# Patient Record
Sex: Female | Born: 1975 | Race: Black or African American | Hispanic: No | Marital: Married | Smoking: Never smoker
Health system: Southern US, Community
[De-identification: ages and names within clinical notes are randomized; demographics above are authoritative.]

## PROBLEM LIST (undated history)

## (undated) DIAGNOSIS — R8789 Other abnormal findings in specimens from female genital organs: Secondary | ICD-10-CM

## (undated) DIAGNOSIS — R87619 Unspecified abnormal cytological findings in specimens from cervix uteri: Secondary | ICD-10-CM

## (undated) DIAGNOSIS — I10 Essential (primary) hypertension: Secondary | ICD-10-CM

## (undated) DIAGNOSIS — K219 Gastro-esophageal reflux disease without esophagitis: Secondary | ICD-10-CM

## (undated) DIAGNOSIS — G4733 Obstructive sleep apnea (adult) (pediatric): Secondary | ICD-10-CM

## (undated) DIAGNOSIS — D649 Anemia, unspecified: Secondary | ICD-10-CM

## (undated) DIAGNOSIS — R87618 Other abnormal cytological findings on specimens from cervix uteri: Secondary | ICD-10-CM

## (undated) HISTORY — DX: Anemia, unspecified: D64.9

## (undated) HISTORY — DX: Obstructive sleep apnea (adult) (pediatric): G47.33

## (undated) HISTORY — DX: Gastro-esophageal reflux disease without esophagitis: K21.9

## (undated) HISTORY — DX: Other abnormal cytological findings on specimens from cervix uteri: R87.618

## (undated) HISTORY — DX: Other abnormal findings in specimens from female genital organs: R87.89

## (undated) HISTORY — DX: Unspecified abnormal cytological findings in specimens from cervix uteri: R87.619

---

## 2006-02-21 HISTORY — PX: WISDOM TOOTH EXTRACTION: SHX21

## 2014-02-21 DIAGNOSIS — Z5189 Encounter for other specified aftercare: Secondary | ICD-10-CM

## 2014-02-21 HISTORY — DX: Encounter for other specified aftercare: Z51.89

## 2018-03-25 ENCOUNTER — Emergency Department (HOSPITAL_COMMUNITY)
Admission: EM | Admit: 2018-03-25 | Discharge: 2018-03-26 | Disposition: A | Discharge status: Home / Self Care | Attending: Emergency Medicine | Admitting: Emergency Medicine

## 2018-03-25 ENCOUNTER — Encounter (HOSPITAL_COMMUNITY): Payer: Self-pay | Admitting: Emergency Medicine

## 2018-03-25 DIAGNOSIS — R519 Headache, unspecified: Secondary | ICD-10-CM

## 2018-03-25 DIAGNOSIS — R51 Headache: Secondary | ICD-10-CM | POA: Insufficient documentation

## 2018-03-25 DIAGNOSIS — I10 Essential (primary) hypertension: Secondary | ICD-10-CM | POA: Insufficient documentation

## 2018-03-25 HISTORY — DX: Essential (primary) hypertension: I10

## 2018-03-25 NOTE — ED Triage Notes (Signed)
Pt reports HA with emesis onset earlier this afternoon, approx 3PM. Pt states she checked her BP after and was getting readings (160's/110's) Pt recently moved here and has not established PCP so she has been off her BP meds. No neuro S/S, denies dizziness, lightheadedness, numbness/tingling, weakness, etc.

## 2018-03-26 MED ORDER — NIFEDIPINE ER OSMOTIC RELEASE 30 MG PO TB24: 30.0000 mg | ORAL_TABLET | Freq: Every day | ORAL | 0 refills | Status: DC

## 2018-03-26 NOTE — Discharge Instructions (Addendum)
Continue to try to get established with a primary care provider for further management of your blood pressure. Return to the emergency department with any chest pain, shortness of breath, severe headache, new concern.

## 2018-03-26 NOTE — ED Notes (Addendum)
Pt reports having a headache and going to a friend who does some straight needling. Pt thought this might help her headache. Pt's friend checked her blood pressure and advised her she needed to come into the ED for evaluation. Pt also reports vomiting.

## 2018-03-26 NOTE — ED Notes (Signed)
Patient verbalizes understanding of discharge instructions. Opportunity for questioning and answers were provided. Armband removed by staff, pt discharged from ED home via POV with family. 

## 2018-03-26 NOTE — ED Provider Notes (Signed)
[facility] EMERGENCY DEPARTMENT Provider Note   CSN: [id number] Arrival date & time: 03/25/18  2242     History   Chief Complaint Chief Complaint  Patient presents with  . Hypertension    HPI Tonya Dominguez is a 42 y.o. female.  Patient to ED with concern for elevated blood pressure. She has a history of Hypertension, out of medication since August of last year. She started having a headache earlier today, frontal, bilateral, without modifying factors. History of migraine with different symptoms. She reports nausea with vomiting x 2 today, but also that she colored her hair and felt typical nausea at the smell of the chemicals. No weakness, chest pain, SOB, extremity edema, visual impairment.    Hypertension  Associated symptoms include headaches. Pertinent negatives include no chest pain, no abdominal pain and no shortness of breath.    Past Medical History:  Diagnosis Date  . Hypertension     There are no active problems to display for this patient.   Past Surgical History:  Procedure Laterality Date  . CESAREAN SECTION       OB History   No obstetric history on file.      Home Medications    Prior to Admission medications   Not on File    Family History No family history on file.  Social History Social History   Tobacco Use  . Smoking status: Never Smoker  . Smokeless tobacco: Never Used  Substance Use Topics  . Alcohol use: Never    Frequency: Never  . Drug use: Never     Allergies   Patient has no allergy information on record.   Review of Systems Review of Systems  Constitutional: Negative for chills and fever.  HENT: Negative.   Respiratory: Negative.  Negative for shortness of breath.   Cardiovascular: Negative.  Negative for chest pain, palpitations and leg swelling.  Gastrointestinal: Positive for nausea and vomiting. Negative for abdominal pain.  Musculoskeletal: Negative.   Skin: Negative.   Neurological:  Positive for headaches. Negative for facial asymmetry, speech difficulty, weakness and numbness.     Physical Exam Updated Vital Signs BP (!) 162/109 (BP Location: Right Arm)   Pulse 71   Temp 98.3 F (36.8 C) (Oral)   Resp 18   Ht 5\' 6"  (1.676 m)   Wt 102.1 kg   SpO2 100%   BMI 36.32 kg/m   Physical Exam Vitals signs and nursing note reviewed.  Constitutional:      Appearance: She is well-developed.  HENT:     Head: Normocephalic.     Nose:     Right Sinus: Maxillary sinus tenderness present.     Left Sinus: Maxillary sinus tenderness present.     Mouth/Throat:     Mouth: Mucous membranes are moist.  Eyes:     Extraocular Movements: Extraocular movements intact.     Conjunctiva/sclera: Conjunctivae normal.     Pupils: Pupils are equal, round, and reactive to light.     Comments: No papilledema   Neck:     Musculoskeletal: Normal range of motion and neck supple. No muscular tenderness.     Vascular: No carotid bruit.  Cardiovascular:     Rate and Rhythm: Normal rate and regular rhythm.  Pulmonary:     Effort: Pulmonary effort is normal.     Breath sounds: Normal breath sounds.  Abdominal:     General: Bowel sounds are normal.     Palpations: Abdomen is soft.  Tenderness: There is no abdominal tenderness. There is no guarding or rebound.  Musculoskeletal: Normal range of motion.     Right lower leg: No edema.     Left lower leg: No edema.  Skin:    General: Skin is warm and dry.     Findings: No rash.  Neurological:     Mental Status: She is alert and oriented to person, place, and time.     Comments: Hyperreflexia bilateral LE's. CN's 3-12 grossly intact. Speech is clear and focused. No facial asymmetry. No lateralizing weakness. Reflexes are equal. No deficits of coordination. Ambulatory without imbalance.        ED Treatments / Results  Labs (all labs ordered are listed, but only abnormal results are displayed) Labs Reviewed - No data to  display  EKG None  Radiology No results found.  Procedures Procedures (including critical care time)  Medications Ordered in ED Medications - No data to display   Initial Impression / Assessment and Plan / ED Course  I have reviewed the triage vital signs and the nursing notes.  Pertinent labs & imaging results that were available during my care of the patient were reviewed by me and considered in my medical decision making (see chart for details).     Patient to ED with concern for untreated blood pressure, found elevated today. She had a headache with nausea and vomiting as well. Headache has almost completely resolved spontaneously. Nausea resolved.   She has a normal neurologic exam. She has mild maxillary sinus tenderness on exam. Do not feel the headache is significant neurologically. No concerning symptoms related to blood pressure. She states she was on nifedipine 30 mg daily last year. Will restart her medication. She is actively working on establishing with primary care now.   Discussed return precautions.   Final Clinical Impressions(s) / ED Diagnoses   Final diagnoses:  None   1. Hypertension 2. Sinus headache  ED Discharge Orders    None       Charlann Lange, PA-C 03/26/18 0159    Orpah Greek, MD 03/26/18 [phone]

## 2018-04-04 ENCOUNTER — Ambulatory Visit (INDEPENDENT_AMBULATORY_CARE_PROVIDER_SITE_OTHER): Admitting: Family Medicine

## 2018-04-04 ENCOUNTER — Encounter: Payer: Self-pay | Admitting: Family Medicine

## 2018-04-04 VITALS — BP 140/92 | HR 54 | Temp 97.7°F | Resp 16 | Ht 65.5 in | Wt 225.4 lb

## 2018-04-04 DIAGNOSIS — Z7689 Persons encountering health services in other specified circumstances: Secondary | ICD-10-CM

## 2018-04-04 DIAGNOSIS — E669 Obesity, unspecified: Secondary | ICD-10-CM | POA: Insufficient documentation

## 2018-04-04 DIAGNOSIS — I10 Essential (primary) hypertension: Secondary | ICD-10-CM | POA: Insufficient documentation

## 2018-04-04 DIAGNOSIS — R87619 Unspecified abnormal cytological findings in specimens from cervix uteri: Secondary | ICD-10-CM | POA: Insufficient documentation

## 2018-04-04 DIAGNOSIS — Z9889 Other specified postprocedural states: Secondary | ICD-10-CM

## 2018-04-04 DIAGNOSIS — Z9189 Other specified personal risk factors, not elsewhere classified: Secondary | ICD-10-CM

## 2018-04-04 DIAGNOSIS — R8789 Other abnormal findings in specimens from female genital organs: Secondary | ICD-10-CM

## 2018-04-04 DIAGNOSIS — R87618 Other abnormal cytological findings on specimens from cervix uteri: Secondary | ICD-10-CM | POA: Insufficient documentation

## 2018-04-04 DIAGNOSIS — R0683 Snoring: Secondary | ICD-10-CM | POA: Insufficient documentation

## 2018-04-04 DIAGNOSIS — G4719 Other hypersomnia: Secondary | ICD-10-CM | POA: Insufficient documentation

## 2018-04-04 DIAGNOSIS — Z1239 Encounter for other screening for malignant neoplasm of breast: Secondary | ICD-10-CM

## 2018-04-04 DIAGNOSIS — Z833 Family history of diabetes mellitus: Secondary | ICD-10-CM | POA: Insufficient documentation

## 2018-04-04 DIAGNOSIS — G4733 Obstructive sleep apnea (adult) (pediatric): Secondary | ICD-10-CM | POA: Insufficient documentation

## 2018-04-04 HISTORY — DX: Other specified postprocedural states: Z98.890

## 2018-04-04 LAB — POCT URINALYSIS DIP (PROADVANTAGE DEVICE)
Bilirubin, UA: NEGATIVE
Glucose, UA: NEGATIVE mg/dL
Ketones, POC UA: NEGATIVE mg/dL
LEUKOCYTES UA: NEGATIVE
Nitrite, UA: NEGATIVE
Protein Ur, POC: NEGATIVE mg/dL
Specific Gravity, Urine: 1.015
Urobilinogen, Ur: NEGATIVE
pH, UA: 6.5 (ref 5.0–8.0)

## 2018-04-04 NOTE — Patient Instructions (Addendum)
[facility]   Continue checking your BP twice daily for now. If your readings are not closer to goal range (<130/80) in the next couple of weeks then iIncrease your nifedipine to 60 mg and return to see me in 4 weeks.   Cut back on sodium, read the nutrition labels. No more than 2,500 mg of sodium daily.   Call and schedule with an OB/GYN. Some offices are listed below.    DASH Eating Plan DASH stands for "Dietary Approaches to Stop Hypertension." The DASH eating plan is a healthy eating plan that has been shown to reduce high blood pressure (hypertension). It may also reduce your risk for type 2 diabetes, heart disease, and stroke. The DASH eating plan may also help with weight loss. What are tips for following this plan?  General guidelines  Avoid eating more than 2,300 mg (milligrams) of salt (sodium) a day. If you have hypertension, you may need to reduce your sodium intake to 1,500 mg a day.  Limit alcohol intake to no more than 1 drink a day for nonpregnant women and 2 drinks a day for men. One drink equals 12 oz of beer, 5 oz of wine, or 1 oz of hard liquor.  Work with your health care provider to maintain a healthy body weight or to lose weight. Ask what an ideal weight is for you.  Get at least 30 minutes of exercise that causes your heart to beat faster (aerobic exercise) most days of the week. Activities may include walking, swimming, or biking.  Work with your health care provider or diet and nutrition specialist (dietitian) to adjust your eating plan to your individual calorie needs. Reading food labels   Check food labels for the amount of sodium per serving. Choose foods with less than 5 percent of the Daily Value of sodium. Generally, foods with less than 300 mg of sodium per serving fit into this eating plan.  To find whole grains, look for the word "whole" as the first word in the ingredient list. Shopping  Buy products  labeled as "low-sodium" or "no salt added."  Buy fresh foods. Avoid canned foods and premade or frozen meals. Cooking  Avoid adding salt when cooking. Use salt-free seasonings or herbs instead of table salt or sea salt. Check with your health care provider or pharmacist before using salt substitutes.  Do not fry foods. Cook foods using healthy methods such as baking, boiling, grilling, and broiling instead.  Cook with heart-healthy oils, such as olive, canola, soybean, or sunflower oil. Meal planning  Eat a balanced diet that includes: ? 5 or more servings of fruits and vegetables each day. At each meal, try to fill half of your plate with fruits and vegetables. ? Up to 6-8 servings of whole grains each day. ? Less than 6 oz of lean meat, poultry, or fish each day. A 3-oz serving of meat is about the same size as a deck of cards. One egg equals 1 oz. ? 2 servings of low-fat dairy each day. ? A serving of nuts, seeds, or beans 5 times each week. ? Heart-healthy fats. Healthy fats called Omega-3 fatty acids are found in foods such as flaxseeds and coldwater fish, like sardines, salmon, and mackerel.  Limit how much you eat of the following: ? Canned or prepackaged foods. ? Food that is high in trans fat, such as fried foods. ? Food that is high in saturated fat, such as fatty  meat. ? Sweets, desserts, sugary drinks, and other foods with added sugar. ? Full-fat dairy products.  Do not salt foods before eating.  Try to eat at least 2 vegetarian meals each week.  Eat more home-cooked food and less restaurant, buffet, and fast food.  When eating at a restaurant, ask that your food be prepared with less salt or no salt, if possible. What foods are recommended? The items listed may not be a complete list. Talk with your dietitian about what dietary choices are best for you. Grains Whole-grain or whole-wheat bread. Whole-grain or whole-wheat pasta. Brown rice. Modena Morrow. Bulgur.  Whole-grain and low-sodium cereals. Pita bread. Low-fat, low-sodium crackers. Whole-wheat flour tortillas. Vegetables Fresh or frozen vegetables (raw, steamed, roasted, or grilled). Low-sodium or reduced-sodium tomato and vegetable juice. Low-sodium or reduced-sodium tomato sauce and tomato paste. Low-sodium or reduced-sodium canned vegetables. Fruits All fresh, dried, or frozen fruit. Canned fruit in natural juice (without added sugar). Meat and other protein foods Skinless chicken or [place]. Ground chicken or [place]. Pork with fat trimmed off. Fish and seafood. Egg whites. Dried beans, peas, or lentils. Unsalted nuts, nut butters, and seeds. Unsalted canned beans. Lean cuts of beef with fat trimmed off. Low-sodium, lean deli meat. Dairy Low-fat (1%) or fat-free (skim) milk. Fat-free, low-fat, or reduced-fat cheeses. Nonfat, low-sodium ricotta or cottage cheese. Low-fat or nonfat yogurt. Low-fat, low-sodium cheese. Fats and oils Soft margarine without trans fats. Vegetable oil. Low-fat, reduced-fat, or light mayonnaise and salad dressings (reduced-sodium). Canola, safflower, olive, soybean, and sunflower oils. Avocado. Seasoning and other foods Herbs. Spices. Seasoning mixes without salt. Unsalted popcorn and pretzels. Fat-free sweets. What foods are not recommended? The items listed may not be a complete list. Talk with your dietitian about what dietary choices are best for you. Grains Baked goods made with fat, such as croissants, muffins, or some breads. Dry pasta or rice meal packs. Vegetables Creamed or fried vegetables. Vegetables in a cheese sauce. Regular canned vegetables (not low-sodium or reduced-sodium). Regular canned tomato sauce and paste (not low-sodium or reduced-sodium). Regular tomato and vegetable juice (not low-sodium or reduced-sodium). Angie Fava. Olives. Fruits Canned fruit in a light or heavy syrup. Fried fruit. Fruit in cream or butter sauce. Meat and other protein  foods Fatty cuts of meat. Ribs. Fried meat. Berniece Salines. Sausage. Bologna and other processed lunch meats. Salami. Fatback. Hotdogs. Bratwurst. Salted nuts and seeds. Canned beans with added salt. Canned or smoked fish. Whole eggs or egg yolks. Chicken or [place] with skin. Dairy Whole or 2% milk, cream, and half-and-half. Whole or full-fat cream cheese. Whole-fat or sweetened yogurt. Full-fat cheese. Nondairy creamers. Whipped toppings. Processed cheese and cheese spreads. Fats and oils Butter. Stick margarine. Lard. Shortening. Ghee. Bacon fat. Tropical oils, such as coconut, palm kernel, or palm oil. Seasoning and other foods Salted popcorn and pretzels. Onion salt, garlic salt, seasoned salt, table salt, and sea salt. Worcestershire sauce. Tartar sauce. Barbecue sauce. Teriyaki sauce. Soy sauce, including reduced-sodium. Steak sauce. Canned and packaged gravies. Fish sauce. Oyster sauce. Cocktail sauce. Horseradish that you find on the shelf. Ketchup. Mustard. Meat flavorings and tenderizers. Bouillon cubes. Hot sauce and Tabasco sauce. Premade or packaged marinades. Premade or packaged taco seasonings. Relishes. Regular salad dressings. Where to find more information:  National Heart, Lung, and Hall: https://wilson-eaton.com/  American Heart Association: www.heart.org Summary  The DASH eating plan is a healthy eating plan that has been shown to reduce high blood pressure (hypertension). It may also reduce your risk for type  2 diabetes, heart disease, and stroke.  With the DASH eating plan, you should limit salt (sodium) intake to 2,300 mg a day. If you have hypertension, you may need to reduce your sodium intake to 1,500 mg a day.  When on the DASH eating plan, aim to eat more fresh fruits and vegetables, whole grains, lean proteins, low-fat dairy, and heart-healthy fats.  Work with your health care provider or diet and nutrition specialist (dietitian) to adjust your eating plan to your  individual calorie needs. This information is not intended to replace advice given to you by your health care provider. Make sure you discuss any questions you have with your health care provider. Document Released: 01/27/2011 Document Revised: 02/01/2016 Document Reviewed: 02/01/2016 Elsevier Interactive Patient Education  2019 [place] Offices:   [place], [place] [address] Phone: [phone]  [facility] [address] [place], [place] [phone]  [facility] Address: [address] [place], [place] [address] Phone: [phone]  [place] [address] [place], [place] [address] Phone: [phone]

## 2018-04-04 NOTE — Progress Notes (Signed)
Subjective:    Patient ID: Tonya Dominguez, female    DOB: 15-Jul-1975, 43 y.o.   MRN: [id number]  HPI Chief Complaint  Patient presents with  . np    np BP went to ER [facility] sunday   She is new to the practice and here to establish care. Moved here one year ago.  Previous medical care: [facility]. [place] prior to that.   Reports a history of hypertension and was recently seen in the ED on 03/25/2018 and started back on blood pressure medication.  She reportedly had been out of medication for several months. States her BP has been elevated since delivery of her second child 3 years ago. She nursed until 69 months.   Checks her blood pressure at home and the readings have been in the 140s/80s-90s. .   She has taken lisinopril in the past. She did not want to take it due to potential side effects.   Diet has been high in sodium.   States her husband tells her she snores which is new over the past few months. She is very tired during the daytime and is concerned she may have sleep apnea. Does not feel well rested in the mornings. Falls asleep when sitting down during the afternoon.   Denies fever, chills, dizziness, chest pain, palpitations, shortness of breath, abdominal pain, N/V/D, urinary symptoms, LE edema.    LMP: 04/01/2018 Is not planning on having another child but is not using contraception.   Married and has 2 children. 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   Alcohol use is social only.  Never smoked. No recreational drug use.   Pap smear in past abnormal in the past with HPV. Had a colposcopy. Plans to schedule with an OB/GYN.   States she is overdue for mammogram and requests an order to get this updated.   Reviewed allergies, medications, past medical, surgical, family, and social history.   Review of Systems Pertinent positives and negatives in the history of present illness.     Objective:   Physical Exam BP (!) 140/92   Pulse (!) 54   Temp 97.7  F (36.5 C) (Oral)   Resp 16   Ht 5' 5.5" (1.664 m)   Wt 225 lb 6.4 oz (102.2 kg)   LMP 04/01/2018 (Exact Date)   SpO2 99%   BMI 36.94 kg/m   Alert and oriented and in no distress.  Pharyngeal area is normal. Neck is supple without adenopathy or thyromegaly. Cardiac exam shows a regular sinus rhythm without murmurs or gallops. Lungs are clear to auscultation. Extremities without edema. Skin is warm and dry. PERRLA, CNs intact. DTRs symmetric and normal.       Assessment & Plan:  Essential hypertension - Plan: CBC with Differential/Platelet, Comprehensive metabolic panel, POCT Urinalysis DIP (Proadvantage Device)  Encounter to establish care  Obesity (BMI 30-39.9) - Plan: Lipid panel, TSH, T4, free, Hemoglobin A1c  Pap smear abnormality of cervix/human papillomavirus (HPV) positive  Screening for breast cancer - Plan: MM DIGITAL SCREENING BILATERAL  Family history of diabetes mellitus in first degree relative - Plan: Hemoglobin A1c  History of colposcopy  Snoring - Plan: Home sleep test  Excessive daytime sleepiness - Plan: Home sleep test  At risk for sleep apnea - Plan: Home sleep test  She is a pleasant 43 year old female who is new to the practice and here today to establish care.  She has multiple concerns. Hypertension since having her second child 3  years ago.  She stopped taking blood pressure medication for several months and just started back on nifedipine 1/2 weeks ago when she went to the emergency department for headache and elevated blood pressure.  Blood pressure is gradually improving and she is not having side effects.  We will keep her on her current dose of nifedipine and have her continue checking her blood pressure a couple of times per day for the next 2 to 3 weeks.  Counseling done on healthy diet, specifically cutting back on sodium. We will check labs and have her return in 4 weeks for follow-up on hypertension.  She will bring in her blood pressure  machine and readings.  Discussed that if she continues seeing elevated readings of the next couple of weeks that she may increase her dose to 60 mg. She appears to be at risk for sleep apnea and has started snoring over the past few months.  She is having excessive daytime sleepiness and fatigue.  I will order a home sleep study to evaluate for sleep apnea. Epworth sleepiness scale 11   She is aware that her BMI places her in the obese category.  Counseling done on potential long-term health consequences related to obesity.  History of annual Pap smear and high-risk HPV with colposcopy.  She will call and schedule with an OB/GYN. Mammogram ordered per patient request.  She is aware that she needs to call and schedule this. Request screening for diabetes due to family history.  Urinalysis dipstick: 3+ blood (menses), negative otherwise   Spent 45 minutes face-to-face with patient and at least 50% was in counseling and coordination of care.

## 2018-04-05 LAB — CBC WITH DIFFERENTIAL/PLATELET
Basophils Absolute: 0.1 10*3/uL (ref 0.0–0.2)
Basos: 1 %
EOS (ABSOLUTE): 0.1 10*3/uL (ref 0.0–0.4)
Eos: 1 %
Hematocrit: 37.5 % (ref 34.0–46.6)
Hemoglobin: 11.1 g/dL (ref 11.1–15.9)
Immature Grans (Abs): 0 10*3/uL (ref 0.0–0.1)
Immature Granulocytes: 0 %
Lymphocytes Absolute: 2.5 10*3/uL (ref 0.7–3.1)
Lymphs: 36 %
MCH: 21.6 pg — ABNORMAL LOW (ref 26.6–33.0)
MCHC: 29.6 g/dL — ABNORMAL LOW (ref 31.5–35.7)
MCV: 73 fL — ABNORMAL LOW (ref 79–97)
Monocytes Absolute: 0.6 10*3/uL (ref 0.1–0.9)
Monocytes: 8 %
Neutrophils Absolute: 3.9 10*3/uL (ref 1.4–7.0)
Neutrophils: 54 %
Platelets: 354 10*3/uL (ref 150–450)
RBC: 5.13 x10E6/uL (ref 3.77–5.28)
RDW: 14.6 % (ref 11.7–15.4)
WBC: 7.1 10*3/uL (ref 3.4–10.8)

## 2018-04-05 LAB — COMPREHENSIVE METABOLIC PANEL
ALT: 14 IU/L (ref 0–32)
AST: 18 IU/L (ref 0–40)
Albumin/Globulin Ratio: 1.4 (ref 1.2–2.2)
Albumin: 4.5 g/dL (ref 3.8–4.8)
Alkaline Phosphatase: 85 IU/L (ref 39–117)
BUN/Creatinine Ratio: 12 (ref 9–23)
BUN: 10 mg/dL (ref 6–24)
Bilirubin Total: 0.5 mg/dL (ref 0.0–1.2)
CALCIUM: 9.4 mg/dL (ref 8.7–10.2)
CO2: 21 mmol/L (ref 20–29)
Chloride: 100 mmol/L (ref 96–106)
Creatinine, Ser: 0.86 mg/dL (ref 0.57–1.00)
GFR calc Af Amer: 96 mL/min/{1.73_m2} (ref >59)
GFR, EST NON AFRICAN AMERICAN: 84 mL/min/{1.73_m2} (ref >59)
Globulin, Total: 3.3 g/dL (ref 1.5–4.5)
Glucose: 82 mg/dL (ref 65–99)
Potassium: 4.1 mmol/L (ref 3.5–5.2)
Sodium: 136 mmol/L (ref 134–144)
Total Protein: 7.8 g/dL (ref 6.0–8.5)

## 2018-04-05 LAB — T4, FREE: Free T4: 1.21 ng/dL (ref 0.82–1.77)

## 2018-04-05 LAB — LIPID PANEL
Chol/HDL Ratio: 4.3 ratio (ref 0.0–4.4)
Cholesterol, Total: 209 mg/dL — ABNORMAL HIGH (ref 100–199)
HDL: 49 mg/dL (ref >39)
LDL Calculated: 140 mg/dL — ABNORMAL HIGH (ref 0–99)
TRIGLYCERIDES: 100 mg/dL (ref 0–149)
VLDL Cholesterol Cal: 20 mg/dL (ref 5–40)

## 2018-04-05 LAB — TSH: TSH: 1.41 u[IU]/mL (ref 0.450–4.500)

## 2018-04-05 LAB — HEMOGLOBIN A1C
Est. average glucose Bld gHb Est-mCnc: 105 mg/dL
Hgb A1c MFr Bld: 5.3 % (ref 4.8–5.6)

## 2018-05-02 ENCOUNTER — Other Ambulatory Visit: Payer: Self-pay

## 2018-05-02 ENCOUNTER — Encounter: Payer: Self-pay | Admitting: Family Medicine

## 2018-05-02 ENCOUNTER — Ambulatory Visit (INDEPENDENT_AMBULATORY_CARE_PROVIDER_SITE_OTHER): Admitting: Family Medicine

## 2018-05-02 VITALS — BP 128/90 | HR 88 | Wt 213.8 lb

## 2018-05-02 DIAGNOSIS — E669 Obesity, unspecified: Secondary | ICD-10-CM | POA: Diagnosis not present

## 2018-05-02 DIAGNOSIS — I10 Essential (primary) hypertension: Secondary | ICD-10-CM | POA: Diagnosis not present

## 2018-05-02 NOTE — Progress Notes (Signed)
   Subjective:    Patient ID: Tonya Dominguez, female    DOB: Dec 31, 1975, 43 y.o.   MRN: [id number]  HPI Chief Complaint  Patient presents with  . follow-up    4 week follow-up on bp, today was 122/81, 109/71. running in the normal range    She is here for a 4 week follow-up on hypertension.  She is taking daily nifedipine 30 mg.  Reports blood pressures are in goal range.  No concerns.  Diet low in sodium. Gave up sugar and meat. Lost 12 lbs.  Exercising.   I ordered a sleep study for her and she has an appointment next week.   Denies fever, chills, dizziness, chest pain, palpitations, shortness of breath, abdominal pain, N/V/D, urinary symptoms, LE edema.     Review of Systems Pertinent positives and negatives in the history of present illness.     Objective:   Physical Exam BP 128/90   Pulse 88   Wt 213 lb 12.8 oz (97 kg)   BMI 35.04 kg/m   Alert and oriented and in no acute distress.  Not otherwise examined.      Assessment & Plan:  Essential hypertension  Obesity (BMI 30-39.9)  Congratulated her on making healthy lifestyle changes including diet and exercise.  She reports good daily compliance with nifedipine and no side effects.  Blood pressure readings at home have been in goal range.  She is aware that today her diastolic is elevated.  She will continue to keep an eye on her blood pressure at home.  She will report back if her readings are not consistently in goal range. She plans to continue with weight loss. Has an appointment next week for sleep study.  Suspect sleep apnea.

## 2018-05-09 ENCOUNTER — Ambulatory Visit
Admission: RE | Admit: 2018-05-09 | Discharge: 2018-05-09 | Disposition: A | Discharge status: Home / Self Care | Source: Ambulatory Visit | Attending: Family Medicine | Admitting: Family Medicine

## 2018-05-09 ENCOUNTER — Other Ambulatory Visit: Payer: Self-pay

## 2018-05-09 ENCOUNTER — Ambulatory Visit (HOSPITAL_BASED_OUTPATIENT_CLINIC_OR_DEPARTMENT_OTHER): Attending: Family Medicine | Admitting: Internal Medicine

## 2018-05-09 DIAGNOSIS — Z9189 Other specified personal risk factors, not elsewhere classified: Secondary | ICD-10-CM

## 2018-05-09 DIAGNOSIS — G4733 Obstructive sleep apnea (adult) (pediatric): Secondary | ICD-10-CM | POA: Insufficient documentation

## 2018-05-09 DIAGNOSIS — G4719 Other hypersomnia: Secondary | ICD-10-CM

## 2018-05-09 DIAGNOSIS — R0683 Snoring: Secondary | ICD-10-CM

## 2018-05-09 DIAGNOSIS — Z1239 Encounter for other screening for malignant neoplasm of breast: Secondary | ICD-10-CM

## 2018-05-19 DIAGNOSIS — R0683 Snoring: Secondary | ICD-10-CM

## 2018-05-19 NOTE — Procedures (Signed)
    Patient Name: Tonya Dominguez, Tonya Dominguez Date: 05/09/2018 Gender: Female D.O.B: October 24, 1975 Age (years): 13 Referring Provider: Girtha Rm NP Height (inches): 25 Interpreting Physician: Baird Lyons MD, ABSM Weight (lbs): 213 RPSGT: Jacolyn Reedy BMI: 35 MRN: [id number] Neck Size: 15.00  CLINICAL INFORMATION Sleep Study Type: HST Indication for sleep study: Excessive Daytime Sleepiness, Snoring (786.09) Epworth Sleepiness Score: 13  SLEEP STUDY TECHNIQUE A multi-channel overnight portable sleep study was performed. The channels recorded were: nasal airflow, thoracic respiratory movement, and oxygen saturation with a pulse oximetry. Snoring was also monitored.  MEDICATIONS Patient self administered medications include: none reported.  SLEEP ARCHITECTURE Patient was studied for 457.3 minutes. The sleep efficiency was 100.0 % and the patient was supine for 67.6%. The arousal index was 0.0 per hour.  RESPIRATORY PARAMETERS The overall AHI was 5.1 per hour, with a central apnea index of 0.0 per hour. The oxygen nadir was 67% during sleep.  CARDIAC DATA Mean heart rate during sleep was 64.2 bpm.  IMPRESSIONS - Mild obstructive sleep apnea occurred during this study (AHI = 5.1/h). - No significant central sleep apnea occurred during this study (CAI = 0.0/h). - Moderate oxygen desaturation was noted during this study (Min O2 = 67%). Mean O2 sat 97%.  - Time with O2 saturation 89% or less was 30 minutes. - Patient snored .  DIAGNOSIS - Obstructive Sleep Apnea (327.23 [G47.33 ICD-10])  RECOMMENDATIONS - Treatment for minimal OSA is directed at symptoms. Conservative options may include observation, weight loss and sleep position off back. - Other options including CPAP, a fitted oral appliance, or ENT evaluation, would be based on cvlinical judgment. - Be careful with alcohol, sedatives and other CNS depressants that may worsen sleep apnea and disrupt normal sleep  architecture. - Sleep hygiene should be reviewed to assess factors that may improve sleep quality. - Weight management and regular exercise should be initiated or continued.  [Electronically signed] 05/19/2018 11:03 AM  Baird Lyons MD, ABSM Diplomate, American Board of Sleep Medicine   NPI: [id number]                         [place] [place] [facility]  ELECTRONICALLY SIGNED ON:  05/19/2018, 10:55 AM [place] PH: [phone]   FX: [phone] [place]

## 2018-05-20 ENCOUNTER — Encounter: Payer: Self-pay | Admitting: Family Medicine

## 2018-05-20 DIAGNOSIS — G4733 Obstructive sleep apnea (adult) (pediatric): Secondary | ICD-10-CM

## 2018-05-20 HISTORY — DX: Obstructive sleep apnea (adult) (pediatric): G47.33

## 2018-05-21 ENCOUNTER — Other Ambulatory Visit: Payer: Self-pay | Admitting: Internal Medicine

## 2018-05-21 DIAGNOSIS — G4733 Obstructive sleep apnea (adult) (pediatric): Secondary | ICD-10-CM

## 2018-05-21 MED ORDER — NIFEDIPINE ER OSMOTIC RELEASE 30 MG PO TB24: 30.0000 mg | ORAL_TABLET | Freq: Every day | ORAL | 2 refills | Status: DC

## 2018-08-08 ENCOUNTER — Ambulatory Visit: Admitting: Family Medicine

## 2018-08-09 ENCOUNTER — Other Ambulatory Visit: Payer: Self-pay

## 2018-08-09 ENCOUNTER — Ambulatory Visit (INDEPENDENT_AMBULATORY_CARE_PROVIDER_SITE_OTHER): Admitting: Family Medicine

## 2018-08-09 ENCOUNTER — Encounter: Payer: Self-pay | Admitting: Family Medicine

## 2018-08-09 VITALS — BP 140/82 | HR 75 | Temp 97.7°F | Wt 212.8 lb

## 2018-08-09 DIAGNOSIS — Z87448 Personal history of other diseases of urinary system: Secondary | ICD-10-CM

## 2018-08-09 DIAGNOSIS — I10 Essential (primary) hypertension: Secondary | ICD-10-CM | POA: Diagnosis not present

## 2018-08-09 DIAGNOSIS — E669 Obesity, unspecified: Secondary | ICD-10-CM

## 2018-08-09 DIAGNOSIS — G4733 Obstructive sleep apnea (adult) (pediatric): Secondary | ICD-10-CM

## 2018-08-09 LAB — POCT URINALYSIS DIP (PROADVANTAGE DEVICE)
Bilirubin, UA: NEGATIVE
Blood, UA: NEGATIVE
Glucose, UA: NEGATIVE mg/dL
Ketones, POC UA: NEGATIVE mg/dL
Leukocytes, UA: NEGATIVE
Nitrite, UA: NEGATIVE
Protein Ur, POC: NEGATIVE mg/dL
Specific Gravity, Urine: 1.01
Urobilinogen, Ur: NEGATIVE
pH, UA: 7 (ref 5.0–8.0)

## 2018-08-09 MED ORDER — NIFEDIPINE ER OSMOTIC RELEASE 60 MG PO TB24: 60.0000 mg | ORAL_TABLET | Freq: Every day | ORAL | 2 refills | Status: DC

## 2018-08-09 NOTE — Patient Instructions (Addendum)
Start the 60 mg nifedipine XL tablets once daily.   Continue eating a low sodium diet.   Keep an eye on your BP at home. Goal BP is <130/80 consistently.   Please message me with your BP readings in 2 weeks.   Follow up in December for your fasting annual exam.     DASH Eating Plan DASH stands for "Dietary Approaches to Stop Hypertension." The DASH eating plan is a healthy eating plan that has been shown to reduce high blood pressure (hypertension). It may also reduce your risk for type 2 diabetes, heart disease, and stroke. The DASH eating plan may also help with weight loss. What are tips for following this plan?  General guidelines  Avoid eating more than 2,300 mg (milligrams) of salt (sodium) a day. If you have hypertension, you may need to reduce your sodium intake to 1,500 mg a day.  Limit alcohol intake to no more than 1 drink a day for nonpregnant women and 2 drinks a day for men. One drink equals 12 oz of beer, 5 oz of wine, or 1 oz of hard liquor.  Work with your health care provider to maintain a healthy body weight or to lose weight. Ask what an ideal weight is for you.  Get at least 30 minutes of exercise that causes your heart to beat faster (aerobic exercise) most days of the week. Activities may include walking, swimming, or biking.  Work with your health care provider or diet and nutrition specialist (dietitian) to adjust your eating plan to your individual calorie needs. Reading food labels   Check food labels for the amount of sodium per serving. Choose foods with less than 5 percent of the Daily Value of sodium. Generally, foods with less than 300 mg of sodium per serving fit into this eating plan.  To find whole grains, look for the word "whole" as the first word in the ingredient list. Shopping  Buy products labeled as "low-sodium" or "no salt added."  Buy fresh foods. Avoid canned foods and premade or frozen meals. Cooking  Avoid adding salt when  cooking. Use salt-free seasonings or herbs instead of table salt or sea salt. Check with your health care provider or pharmacist before using salt substitutes.  Do not fry foods. Cook foods using healthy methods such as baking, boiling, grilling, and broiling instead.  Cook with heart-healthy oils, such as olive, canola, soybean, or sunflower oil. Meal planning  Eat a balanced diet that includes: ? 5 or more servings of fruits and vegetables each day. At each meal, try to fill half of your plate with fruits and vegetables. ? Up to 6-8 servings of whole grains each day. ? Less than 6 oz of lean meat, poultry, or fish each day. A 3-oz serving of meat is about the same size as a deck of cards. One egg equals 1 oz. ? 2 servings of low-fat dairy each day. ? A serving of nuts, seeds, or beans 5 times each week. ? Heart-healthy fats. Healthy fats called Omega-3 fatty acids are found in foods such as flaxseeds and coldwater fish, like sardines, salmon, and mackerel.  Limit how much you eat of the following: ? Canned or prepackaged foods. ? Food that is high in trans fat, such as fried foods. ? Food that is high in saturated fat, such as fatty meat. ? Sweets, desserts, sugary drinks, and other foods with added sugar. ? Full-fat dairy products.  Do not salt foods before eating.  Try  to eat at least 2 vegetarian meals each week.  Eat more home-cooked food and less restaurant, buffet, and fast food.  When eating at a restaurant, ask that your food be prepared with less salt or no salt, if possible. What foods are recommended? The items listed may not be a complete list. Talk with your dietitian about what dietary choices are best for you. Grains Whole-grain or whole-wheat bread. Whole-grain or whole-wheat pasta. Brown rice. [place]. Bulgur. Whole-grain and low-sodium cereals. Pita bread. Low-fat, low-sodium crackers. Whole-wheat flour tortillas. Vegetables Fresh or frozen vegetables  (raw, steamed, roasted, or grilled). Low-sodium or reduced-sodium tomato and vegetable juice. Low-sodium or reduced-sodium tomato sauce and tomato paste. Low-sodium or reduced-sodium canned vegetables. Fruits All fresh, dried, or frozen fruit. Canned fruit in natural juice (without added sugar). Meat and other protein foods Skinless chicken or [place]. Ground chicken or [place]. Pork with fat trimmed off. Fish and seafood. Egg whites. Dried beans, peas, or lentils. Unsalted nuts, nut butters, and seeds. Unsalted canned beans. Lean cuts of beef with fat trimmed off. Low-sodium, lean deli meat. Dairy Low-fat (1%) or fat-free (skim) milk. Fat-free, low-fat, or reduced-fat cheeses. Nonfat, low-sodium ricotta or cottage cheese. Low-fat or nonfat yogurt. Low-fat, low-sodium cheese. Fats and oils Soft margarine without trans fats. Vegetable oil. Low-fat, reduced-fat, or light mayonnaise and salad dressings (reduced-sodium). Canola, safflower, olive, soybean, and sunflower oils. Avocado. Seasoning and other foods Herbs. Spices. Seasoning mixes without salt. Unsalted popcorn and pretzels. Fat-free sweets. What foods are not recommended? The items listed may not be a complete list. Talk with your dietitian about what dietary choices are best for you. Grains Baked goods made with fat, such as croissants, muffins, or some breads. Dry pasta or rice meal packs. Vegetables Creamed or fried vegetables. Vegetables in a cheese sauce. Regular canned vegetables (not low-sodium or reduced-sodium). Regular canned tomato sauce and paste (not low-sodium or reduced-sodium). Regular tomato and vegetable juice (not low-sodium or reduced-sodium). Angie Fava. Olives. Fruits Canned fruit in a light or heavy syrup. Fried fruit. Fruit in cream or butter sauce. Meat and other protein foods Fatty cuts of meat. Ribs. Fried meat. Berniece Salines. Sausage. Bologna and other processed lunch meats. Salami. Fatback. Hotdogs. Bratwurst. Salted nuts  and seeds. Canned beans with added salt. Canned or smoked fish. Whole eggs or egg yolks. Chicken or [place] with skin. Dairy Whole or 2% milk, cream, and half-and-half. Whole or full-fat cream cheese. Whole-fat or sweetened yogurt. Full-fat cheese. Nondairy creamers. Whipped toppings. Processed cheese and cheese spreads. Fats and oils Butter. Stick margarine. Lard. Shortening. Ghee. Bacon fat. Tropical oils, such as coconut, palm kernel, or palm oil. Seasoning and other foods Salted popcorn and pretzels. Onion salt, garlic salt, seasoned salt, table salt, and sea salt. Worcestershire sauce. Tartar sauce. Barbecue sauce. Teriyaki sauce. Soy sauce, including reduced-sodium. Steak sauce. Canned and packaged gravies. Fish sauce. Oyster sauce. Cocktail sauce. Horseradish that you find on the shelf. Ketchup. Mustard. Meat flavorings and tenderizers. Bouillon cubes. Hot sauce and Tabasco sauce. Premade or packaged marinades. Premade or packaged taco seasonings. Relishes. Regular salad dressings. Where to find more information:  National Heart, Lung, and [place]: https://wilson-eaton.com/  American Heart Association: www.heart.org Summary  The DASH eating plan is a healthy eating plan that has been shown to reduce high blood pressure (hypertension). It may also reduce your risk for type 2 diabetes, heart disease, and stroke.  With the DASH eating plan, you should limit salt (sodium) intake to 2,300 mg a day. If you  have hypertension, you may need to reduce your sodium intake to 1,500 mg a day.  When on the DASH eating plan, aim to eat more fresh fruits and vegetables, whole grains, lean proteins, low-fat dairy, and heart-healthy fats.  Work with your health care provider or diet and nutrition specialist (dietitian) to adjust your eating plan to your individual calorie needs. This information is not intended to replace advice given to you by your health care provider. Make sure you discuss any questions  you have with your health care provider. Document Released: 01/27/2011 Document Revised: 02/01/2016 Document Reviewed: 02/01/2016 Elsevier Interactive Patient Education  2019 [employer].

## 2018-08-09 NOTE — Progress Notes (Signed)
   Subjective:    Patient ID: Tonya Dominguez, female    DOB: 1975/05/25, 43 y.o.   MRN: [id number]  HPI Chief Complaint  Patient presents with  . 3 month follow-up    osa, bp- doing well but bottom number is alittle high but doing a cleanse.    She is here for a follow up on HTN, OSA and hematuria.   BP at home has been elevated. Ranging from 135-140s-/85-90s. Reports taking nifedipine daily. No side effects. No new concerns.   OSA- using CPAP nightly and has been since March. Sleeping has improved as well as energy level.   She is doing a detox cleanse currently. Avoiding sodium.   She had blood in her urine last visit. No urinary symptoms.   Denies fever, chills, dizziness, chest pain, palpitations, shortness of breath, abdominal pain, N/V/D, LE edema.   Reviewed allergies, medications, past medical, surgical, family, and social history.   Review of Systems Pertinent positives and negatives in the history of present illness.     Objective:   Physical Exam BP 140/82   Pulse 75   Temp 97.7 F (36.5 C) (Oral)   Wt 212 lb 12.8 oz (96.5 kg)   LMP 07/31/2018   BMI 35.41 kg/m   Alert and oriented in no acute distress.  Extremities without edema.      Assessment & Plan:  Essential hypertension - Plan: She is not at goal.  We will increase nifedipine.  Second agent not needed at this time.  She is still having regular periods and not using contraception.  DASH diet handout was given.  Counseling on low-sodium diet and exercise.  Encouraged her to keep an eye on her blood pressure at home and send me her readings in 2 weeks.  History of blood in urine - Plan: POCT Urinalysis DIP (Proadvantage Device), negative today. No further follow up needed.   OSA (obstructive sleep apnea) - Plan: She is using CPAP nightly and benefiting from this.  She should continue using CPAP.  Obesity (BMI 30-39.9) - Plan: She is aware that weight loss could help hypertension and overall health.  She  is currently eating a clean diet.  She will be due for an annual exam in December.  This will be fasting to recheck lipids.

## 2018-11-03 ENCOUNTER — Other Ambulatory Visit: Payer: Self-pay | Admitting: Family Medicine

## 2018-11-03 DIAGNOSIS — I10 Essential (primary) hypertension: Secondary | ICD-10-CM

## 2018-11-23 LAB — HM PAP SMEAR: HM Pap smear: NEGATIVE

## 2019-01-22 ENCOUNTER — Encounter: Payer: Self-pay | Admitting: Family Medicine

## 2019-01-22 ENCOUNTER — Ambulatory Visit (INDEPENDENT_AMBULATORY_CARE_PROVIDER_SITE_OTHER): Admitting: Family Medicine

## 2019-01-22 ENCOUNTER — Other Ambulatory Visit: Payer: Self-pay

## 2019-01-22 VITALS — Temp 98.9°F | Wt 212.0 lb

## 2019-01-22 DIAGNOSIS — J069 Acute upper respiratory infection, unspecified: Secondary | ICD-10-CM | POA: Diagnosis not present

## 2019-01-22 NOTE — Progress Notes (Signed)
   Subjective:    Patient ID: Tonya Dominguez, female    DOB: 08-20-1975, 43 y.o.   MRN: [id number]  HPI Documentation for virtual telephone encounter.  Documentation for virtual audio and video telecommunications through WebEx encounter: The patient was located at home. The provider was located in the office. The patient did consent to this visit and is aware of possible charges through their insurance for this visit. The other persons participating in this telemedicine service were none. This virtual service is not related to other E/M service within previous 7 days. She states that Friday she developed a slight sore throat with some postnasal drainage.  On Saturday she noted hoarse voice, nasal congestion and coughing.  She was seen in employee health on Monday because of the above symptoms and also loss of sense of smell.  The sense of smell did come back after she cleared her nose.  She has had no fever, shortness of breath.  They did a rapid Covid test on her which was negative.   Review of Systems     Objective:   Physical Exam Alert and visually in no distress otherwise not examined       Assessment & Plan:  Viral URI with cough Since her Covid test is negative and her symptoms sound very much like URI I think she can continue to work and wear a mask as well as eye protection.  Recommend Robitussin-DM during the day and NyQuil at night.  Explained this should go away in 7 to 10 days but if this gets worse, she should call.  I will send a note to her employer concerning this.

## 2019-01-23 ENCOUNTER — Encounter: Admitting: Family Medicine

## 2019-02-18 ENCOUNTER — Other Ambulatory Visit: Payer: Self-pay | Admitting: Family Medicine

## 2019-02-18 DIAGNOSIS — I10 Essential (primary) hypertension: Secondary | ICD-10-CM

## 2019-04-20 ENCOUNTER — Other Ambulatory Visit: Payer: Self-pay | Admitting: Family Medicine

## 2019-04-20 DIAGNOSIS — I10 Essential (primary) hypertension: Secondary | ICD-10-CM

## 2019-04-22 NOTE — Telephone Encounter (Signed)
Left message for pt to call back to schedule appt

## 2019-04-23 ENCOUNTER — Telehealth: Payer: Self-pay | Admitting: Family Medicine

## 2019-04-23 DIAGNOSIS — I10 Essential (primary) hypertension: Secondary | ICD-10-CM

## 2019-04-23 MED ORDER — NIFEDIPINE ER OSMOTIC RELEASE 60 MG PO TB24: ORAL_TABLET | ORAL | 0 refills | Status: DC

## 2019-04-23 NOTE — Telephone Encounter (Signed)
done

## 2019-04-23 NOTE — Telephone Encounter (Signed)
Pt called and made a cpe appt. Please refill nifedipine to [facility].

## 2019-05-21 DIAGNOSIS — E7800 Pure hypercholesterolemia, unspecified: Secondary | ICD-10-CM | POA: Insufficient documentation

## 2019-05-21 HISTORY — DX: Pure hypercholesterolemia, unspecified: E78.00

## 2019-05-21 NOTE — Patient Instructions (Addendum)
Here is a list of Eye Doctors that you call and schedule an appointment with.   [facility] Address: [address], [place], [place] [address] Phone: [phone]   [facility] Address: [address], [place], [place] [address] Phone: [phone]  Dr[facility] Address: [address], [place], [place] [address] Phone: [phone]     Preventive Care 40-44 Years Old, Female Preventive care refers to visits with your health care provider and lifestyle choices that can promote health and wellness. This includes:  A yearly physical exam. This may also be called an annual well check.  Regular dental visits and eye exams.  Immunizations.  Screening for certain conditions.  Healthy lifestyle choices, such as eating a healthy diet, getting regular exercise, not using drugs or products that contain nicotine and tobacco, and limiting alcohol use. What can I expect for my preventive care visit? Physical exam Your health care provider will check your:  Height and weight. This may be used to calculate body mass index (BMI), which tells if you are at a healthy weight.  Heart rate and blood pressure.  Skin for abnormal spots. Counseling Your health care provider may ask you questions about your:  Alcohol, tobacco, and drug use.  Emotional well-being.  Home and relationship well-being.  Sexual activity.  Eating habits.  Work and work environment.  Method of birth control.  Menstrual cycle.  Pregnancy history. What immunizations do I need?  Influenza (flu) vaccine  This is recommended every year. Tetanus, diphtheria, and pertussis (Tdap) vaccine  You may need a Td booster every 10 years. Varicella (chickenpox) vaccine  You may need this if you have not been vaccinated. Zoster (shingles) vaccine  You may need this after age 60. Measles, mumps, and rubella (MMR) vaccine  You may need at least one dose of MMR if you were born in  1957 or later. You may also need a second dose. Pneumococcal conjugate (PCV13) vaccine  You may need this if you have certain conditions and were not previously vaccinated. Pneumococcal polysaccharide (PPSV23) vaccine  You may need one or two doses if you smoke cigarettes or if you have certain conditions. Meningococcal conjugate (MenACWY) vaccine  You may need this if you have certain conditions. Hepatitis A vaccine  You may need this if you have certain conditions or if you travel or work in places where you may be exposed to hepatitis A. Hepatitis B vaccine  You may need this if you have certain conditions or if you travel or work in places where you may be exposed to hepatitis B. Haemophilus influenzae type b (Hib) vaccine  You may need this if you have certain conditions. Human papillomavirus (HPV) vaccine  If recommended by your health care provider, you may need three doses over 6 months. You may receive vaccines as individual doses or as more than one vaccine together in one shot (combination vaccines). Talk with your health care provider about the risks and benefits of combination vaccines. What tests do I need? Blood tests  Lipid and cholesterol levels. These may be checked every 5 years, or more frequently if you are over 50 years old.  Hepatitis C test.  Hepatitis B test. Screening  Lung cancer screening. You may have this screening every year starting at age 55 if you have a 30-pack-year history of smoking and currently smoke or have quit within the past 15 years.  Colorectal cancer screening. All adults should have this   screening starting at age 50 and continuing until age 75. Your health care provider may recommend screening at age 45 if you are at increased risk. You will have tests every 1-10 years, depending on your results and the type of screening test.  Diabetes screening. This is done by checking your blood sugar (glucose) after you have not eaten for a  while (fasting). You may have this done every 1-3 years.  Mammogram. This may be done every 1-2 years. Talk with your health care provider about when you should start having regular mammograms. This may depend on whether you have a family history of breast cancer.  BRCA-related cancer screening. This may be done if you have a family history of breast, ovarian, tubal, or peritoneal cancers.  Pelvic exam and Pap test. This may be done every 3 years starting at age 21. Starting at age 30, this may be done every 5 years if you have a Pap test in combination with an HPV test. Other tests  Sexually transmitted disease (STD) testing.  Bone density scan. This is done to screen for osteoporosis. You may have this scan if you are at high risk for osteoporosis. Follow these instructions at home: Eating and drinking  Eat a diet that includes fresh fruits and vegetables, whole grains, lean protein, and low-fat dairy.  Take vitamin and mineral supplements as recommended by your health care provider.  Do not drink alcohol if: ? Your health care provider tells you not to drink. ? You are pregnant, may be pregnant, or are planning to become pregnant.  If you drink alcohol: ? Limit how much you have to 0-1 drink a day. ? Be aware of how much alcohol is in your drink. In the [place] one drink equals one 12 oz bottle of beer (355 mL), one 5 oz glass of wine (148 mL), or one 1 oz glass of hard liquor (44 mL). Lifestyle  Take daily care of your teeth and gums.  Stay active. Exercise for at least 30 minutes on 5 or more days each week.  Do not use any products that contain nicotine or tobacco, such as cigarettes, e-cigarettes, and chewing tobacco. If you need help quitting, ask your health care provider.  If you are sexually active, practice safe sex. Use a condom or other form of birth control (contraception) in order to prevent pregnancy and STIs (sexually transmitted infections).  If told by your  health care provider, take low-dose aspirin daily starting at age 50. What's next?  Visit your health care provider once a year for a well check visit.  Ask your health care provider how often you should have your eyes and teeth checked.  Stay up to date on all vaccines. This information is not intended to replace advice given to you by your health care provider. Make sure you discuss any questions you have with your health care provider. Document Revised: 10/19/2017 Document Reviewed: 10/19/2017 Elsevier Patient Education  2020 Elsevier Inc.  

## 2019-05-21 NOTE — Progress Notes (Signed)
Subjective:    Patient ID: Tonya Dominguez, female    DOB: 12-23-1975, 44 y.o.   MRN: [id number]  HPI Chief Complaint  Patient presents with  . fasting cpe    fasting cpe, no other concerns. has obgyn    She is here for a complete physical exam. Previous medical care: Moved here 2 years ago.  [facility]. [place] prior to that.  Last CPE: years ago   Other providers: OB/GYN - [place]   HTN- no issues with medication. Checks hr BP and it is doing fine   OSA- using CPAP nightly and doing great. Sleeping well, as good as ever.    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   Diet: fairly healthy. Eats a snack around 3 pm.  Excerise: nothing regular   Immunizations: Covid-19 vaccines. Tdap 2016  Health maintenance:  Mammogram: 2020  Colonoscopy: N/A Last Gynecological Exam: 11/202 Last Dental Exam: 2 weeks ago  Last Eye Exam: ?  Wears seatbelt always, smoke detectors in home and functioning, does not text while driving and feels safe in home environment.   Reviewed allergies, medications, past medical, surgical, family, and social history.   Review of Systems Review of Systems Constitutional: -fever, -chills, -sweats, -unexpected weight change,-fatigue ENT: -runny nose, -ear pain, -sore throat Cardiology:  -chest pain, -palpitations, -edema Respiratory: -cough, -shortness of breath, -wheezing Gastroenterology: -abdominal pain, -nausea, -vomiting, -diarrhea, -constipation  Hematology: -bleeding or bruising problems Musculoskeletal: -arthralgias, -myalgias, -joint swelling, -back pain Ophthalmology: -vision changes Urology: -dysuria, -difficulty urinating, -hematuria, -urinary frequency, -urgency Neurology: -headache, -weakness, -tingling, -numbness       Objective:   Physical Exam BP 120/70   Pulse 81    Temp (!) 97.5 F (36.4 C)   Ht 5\' 5"  (1.651 m)   Wt 211 lb 12.8 oz (96.1 kg)   BMI 35.25 kg/m   General Appearance:    Alert, cooperative, no distress, appears stated age  Head:    Normocephalic, without obvious abnormality, atraumatic  Eyes:    PERRL, conjunctiva/corneas clear, EOM's intact  Ears:    Normal TM's and external ear canals  Nose:   Mask in place   Throat:   Mask in place   Neck:   Supple, no lymphadenopathy;  thyroid:  no   enlargement/tenderness/nodules; no JVD  Back:    Spine nontender, no curvature, ROM normal, no CVA     tenderness  Lungs:     Clear to auscultation bilaterally without wheezes, rales or     ronchi; respirations unlabored  Chest Wall:    No tenderness or deformity   Heart:    Regular rate and rhythm, S1 and S2 normal, no murmur, rub   or gallop  Breast Exam:    OB/GYN  Abdomen:     Soft, non-tender, nondistended, normoactive bowel sounds,    no masses, no hepatosplenomegaly  Genitalia:    OB/GYN     Extremities:   No clubbing, cyanosis or edema  Pulses:   2+ and symmetric all extremities  Skin:   Skin color, texture, turgor normal, no rashes or lesions  Lymph nodes:   Cervical, supraclavicular, and axillary nodes normal  Neurologic:   CNII-XII intact, normal strength, sensation and gait; reflexes 2+ and symmetric throughout          Psych:  Normal mood, affect, hygiene and grooming.        Assessment & Plan:  Routine general medical examination at a health care facility - Plan: CBC with Differential/Platelet, Comprehensive metabolic panel, TSH, T4, free, Lipid panel -She is here for fasting CPE.  Reviewed preventive health care.  Discussed immunizations.  Counseling healthy lifestyle including diet and exercise.  She will continue seeing her OB/GYN  Essential hypertension -Blood pressures well controlled.  Continue on current medications  OSA (obstructive sleep apnea) -She is using her CPAP nightly and benefiting from it.  I recommend she  continue using it  Obesity (BMI 30-39.9) -Counseling on healthy diet and exercise.  She is not currently exercising.  Elevated LDL cholesterol level - Plan: Lipid panel -Check lipids and follow-up

## 2019-05-22 ENCOUNTER — Other Ambulatory Visit: Payer: Self-pay

## 2019-05-22 ENCOUNTER — Ambulatory Visit (INDEPENDENT_AMBULATORY_CARE_PROVIDER_SITE_OTHER): Admitting: Family Medicine

## 2019-05-22 ENCOUNTER — Encounter: Payer: Self-pay | Admitting: Family Medicine

## 2019-05-22 VITALS — BP 120/70 | HR 81 | Temp 97.5°F | Ht 65.0 in | Wt 211.8 lb

## 2019-05-22 DIAGNOSIS — E7800 Pure hypercholesterolemia, unspecified: Secondary | ICD-10-CM

## 2019-05-22 DIAGNOSIS — E669 Obesity, unspecified: Secondary | ICD-10-CM

## 2019-05-22 DIAGNOSIS — Z0000 Encounter for general adult medical examination without abnormal findings: Secondary | ICD-10-CM

## 2019-05-22 DIAGNOSIS — I10 Essential (primary) hypertension: Secondary | ICD-10-CM | POA: Diagnosis not present

## 2019-05-22 DIAGNOSIS — G4733 Obstructive sleep apnea (adult) (pediatric): Secondary | ICD-10-CM | POA: Diagnosis not present

## 2019-05-23 LAB — LIPID PANEL
Chol/HDL Ratio: 3.3 ratio (ref 0.0–4.4)
Cholesterol, Total: 190 mg/dL (ref 100–199)
HDL: 58 mg/dL (ref >39)
LDL Chol Calc (NIH): 116 mg/dL — ABNORMAL HIGH (ref 0–99)
Triglycerides: 87 mg/dL (ref 0–149)
VLDL Cholesterol Cal: 16 mg/dL (ref 5–40)

## 2019-05-23 LAB — CBC WITH DIFFERENTIAL/PLATELET
Basophils Absolute: 0 10*3/uL (ref 0.0–0.2)
Basos: 1 %
EOS (ABSOLUTE): 0.1 10*3/uL (ref 0.0–0.4)
Eos: 1 %
Hematocrit: 37.7 % (ref 34.0–46.6)
Hemoglobin: 11.3 g/dL (ref 11.1–15.9)
Immature Grans (Abs): 0 10*3/uL (ref 0.0–0.1)
Immature Granulocytes: 0 %
Lymphocytes Absolute: 1.5 10*3/uL (ref 0.7–3.1)
Lymphs: 26 %
MCH: 21.9 pg — ABNORMAL LOW (ref 26.6–33.0)
MCHC: 30 g/dL — ABNORMAL LOW (ref 31.5–35.7)
MCV: 73 fL — ABNORMAL LOW (ref 79–97)
Monocytes Absolute: 0.5 10*3/uL (ref 0.1–0.9)
Monocytes: 8 %
Neutrophils Absolute: 3.7 10*3/uL (ref 1.4–7.0)
Neutrophils: 64 %
Platelets: 330 10*3/uL (ref 150–450)
RBC: 5.15 x10E6/uL (ref 3.77–5.28)
RDW: 14.7 % (ref 11.7–15.4)
WBC: 5.8 10*3/uL (ref 3.4–10.8)

## 2019-05-23 LAB — COMPREHENSIVE METABOLIC PANEL
ALT: 14 IU/L (ref 0–32)
AST: 19 IU/L (ref 0–40)
Albumin/Globulin Ratio: 1.5 (ref 1.2–2.2)
Albumin: 4.6 g/dL (ref 3.8–4.8)
Alkaline Phosphatase: 82 IU/L (ref 39–117)
BUN/Creatinine Ratio: 6 — ABNORMAL LOW (ref 9–23)
BUN: 6 mg/dL (ref 6–24)
Bilirubin Total: 1 mg/dL (ref 0.0–1.2)
CO2: 22 mmol/L (ref 20–29)
Calcium: 9.5 mg/dL (ref 8.7–10.2)
Chloride: 102 mmol/L (ref 96–106)
Creatinine, Ser: 1.05 mg/dL — ABNORMAL HIGH (ref 0.57–1.00)
GFR calc Af Amer: 75 mL/min/{1.73_m2} (ref >59)
GFR calc non Af Amer: 65 mL/min/{1.73_m2} (ref >59)
Globulin, Total: 3.1 g/dL (ref 1.5–4.5)
Glucose: 85 mg/dL (ref 65–99)
Potassium: 4.3 mmol/L (ref 3.5–5.2)
Sodium: 138 mmol/L (ref 134–144)
Total Protein: 7.7 g/dL (ref 6.0–8.5)

## 2019-05-23 LAB — T4, FREE: Free T4: 1.07 ng/dL (ref 0.82–1.77)

## 2019-05-23 LAB — TSH: TSH: 0.939 u[IU]/mL (ref 0.450–4.500)

## 2019-06-04 ENCOUNTER — Telehealth: Payer: Self-pay | Admitting: Family Medicine

## 2019-06-04 NOTE — Telephone Encounter (Signed)
Requested records received from [facility] 

## 2019-06-10 ENCOUNTER — Other Ambulatory Visit: Payer: Self-pay | Admitting: Family Medicine

## 2019-06-10 DIAGNOSIS — I10 Essential (primary) hypertension: Secondary | ICD-10-CM

## 2019-06-11 ENCOUNTER — Encounter: Payer: Self-pay | Admitting: Internal Medicine

## 2019-09-23 ENCOUNTER — Encounter: Payer: Self-pay | Admitting: Internal Medicine

## 2019-12-18 ENCOUNTER — Other Ambulatory Visit: Payer: Self-pay | Admitting: Family Medicine

## 2019-12-18 DIAGNOSIS — I10 Essential (primary) hypertension: Secondary | ICD-10-CM

## 2020-03-16 ENCOUNTER — Other Ambulatory Visit: Payer: Self-pay | Admitting: Family Medicine

## 2020-03-16 DIAGNOSIS — I10 Essential (primary) hypertension: Secondary | ICD-10-CM

## 2020-03-16 NOTE — Telephone Encounter (Signed)
Has appt in april

## 2020-05-26 ENCOUNTER — Encounter: Payer: Self-pay | Admitting: Family Medicine

## 2020-05-27 ENCOUNTER — Encounter: Payer: Self-pay | Admitting: Family Medicine

## 2020-05-27 ENCOUNTER — Other Ambulatory Visit: Payer: Self-pay

## 2020-05-27 ENCOUNTER — Ambulatory Visit (INDEPENDENT_AMBULATORY_CARE_PROVIDER_SITE_OTHER): Admitting: Family Medicine

## 2020-05-27 VITALS — BP 120/74 | HR 79 | Ht 65.25 in | Wt 209.2 lb

## 2020-05-27 DIAGNOSIS — I10 Essential (primary) hypertension: Secondary | ICD-10-CM

## 2020-05-27 DIAGNOSIS — E7800 Pure hypercholesterolemia, unspecified: Secondary | ICD-10-CM

## 2020-05-27 DIAGNOSIS — Z1159 Encounter for screening for other viral diseases: Secondary | ICD-10-CM

## 2020-05-27 DIAGNOSIS — G4733 Obstructive sleep apnea (adult) (pediatric): Secondary | ICD-10-CM

## 2020-05-27 DIAGNOSIS — Z0000 Encounter for general adult medical examination without abnormal findings: Secondary | ICD-10-CM

## 2020-05-27 DIAGNOSIS — Z1329 Encounter for screening for other suspected endocrine disorder: Secondary | ICD-10-CM

## 2020-05-27 LAB — CBC WITH DIFFERENTIAL/PLATELET: Platelets: 350 10*3/uL (ref 150–450)

## 2020-05-27 LAB — LIPID PANEL

## 2020-05-27 LAB — COMPREHENSIVE METABOLIC PANEL

## 2020-05-27 NOTE — Progress Notes (Signed)
Subjective:    Patient ID: Tonya Dominguez, female    DOB: 02-21-1976, 45 y.o.   MRN: [id number]  HPI Chief Complaint  Patient presents with  . Annual Exam    Fasting for cpe. See obgyn.   She is here for a complete physical exam. Last CPE: 04/2019  Other providers: OB/GYN   OSA- uses CPAP nightly and doing well.   She was going to PT for pelvic floor and hip and it helped.   HTN- doing well on medication.   States she has been taking a probiotic for recurrent yeast infections. It has helped.    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   Reviewed allergies, medications, past medical, surgical, family, and social history.   Review of Systems Review of Systems Constitutional: -fever, -chills, -sweats, -unexpected weight change,-fatigue ENT: -runny nose, -ear pain, -sore throat Cardiology:  -chest pain, -palpitations, -edema Respiratory: -cough, -shortness of breath, -wheezing Gastroenterology: -abdominal pain, -nausea, -vomiting, -diarrhea, -constipation  Hematology: -bleeding or bruising problems Musculoskeletal: -arthralgias, -myalgias, -joint swelling, -back pain Ophthalmology: -vision changes Urology: -dysuria, -difficulty urinating, -hematuria, -urinary frequency, -urgency Neurology: -headache, -weakness, -tingling, -numbness       Objective:   Physical Exam BP 120/74   Pulse 79   Ht 5' 5.25" (1.657 m)   Wt 209 lb 3.2 oz (94.9 kg)   LMP 05/22/2020   SpO2 99%   BMI 34.55 kg/m   General Appearance:    Alert, cooperative, no distress, appears  stated age  Head:    Normocephalic, without obvious abnormality, atraumatic  Eyes:    PERRL, conjunctiva/corneas clear, EOM's intact  Ears:    Normal TM's and external ear canals  Nose:   Mask on   Throat:   Mask on   Neck:   Supple, no lymphadenopathy;  thyroid:  no   enlargement/tenderness/nodules; no JVD  Back:    Spine nontender, no curvature, ROM normal, no CVA     tenderness  Lungs:     Clear to auscultation bilaterally without wheezes, rales or     ronchi; respirations unlabored  Chest Wall:    No tenderness or deformity   Heart:    Regular rate and rhythm, S1 and S2 normal, no murmur, rub   or gallop  Breast Exam:    OB/GYN  Abdomen:     Soft, non-tender, nondistended, normoactive bowel sounds,    no masses, no hepatosplenomegaly  Genitalia:    OB/GYN     Extremities:   No clubbing, cyanosis or edema  Pulses:   2+ and symmetric all extremities  Skin:   Skin color, texture, turgor normal, no rashes or lesions  Lymph nodes:   Cervical, supraclavicular, and axillary nodes normal  Neurologic:   CNII-XII intact, normal strength, sensation and gait          Psych:   Normal mood, affect, hygiene and grooming.        Assessment & Plan:  Routine general medical examination at a health care facility - Plan: CBC with Differential/Platelet, Comprehensive metabolic panel, TSH, T4, free, T3, Lipid panel -preventive healthcare  reviewed. Continue seeing OB/GYN. Counseling on healthy lifestyle. Immunizations reviewed. Discussed safety and health promotion  Essential hypertension - Plan: CBC with Differential/Platelet, Comprehensive metabolic panel -controlled. Continue medication and eating a low sodium diet   OSA (obstructive sleep apnea) -continue CPAP  Elevated LDL cholesterol level - Plan: Lipid panel -advised to eat a low fat diet and exercise. Follow up pending lipid panel   Need for hepatitis C screening test - Plan: Hepatitis C antibody -done per screening guidelines    Screening for thyroid disorder - Plan: TSH, T4, free, T3 -follow up pending labs

## 2020-05-27 NOTE — Patient Instructions (Signed)
Here is a list of Eye Doctors that you call and schedule an appointment with.   [facility] Address: [address], [place], [place] [address] Phone: [phone]   [facility] Address: [address], [place], [place] [address] Phone: [phone]  Dr[facility] Gro[facility] Address: [address], [place], [place] [address] Phone: [phone]   Preventive Care 40-45 Years Old, Female Preventive care refers to lifestyle choices and visits with your health care provider that can promote health and wellness. This includes:  A yearly physical exam. This is also called an annual wellness visit.  Regular dental and eye exams.  Immunizations.  Screening for certain conditions.  Healthy lifestyle choices, such as: ? Eating a healthy diet. ? Getting regular exercise. ? Not using drugs or products that contain nicotine and tobacco. ? Limiting alcohol use. What can I expect for my preventive care visit? Physical exam Your health care provider will check your:  Height and weight. These may be used to calculate your BMI (body mass index). BMI is a measurement that tells if you are at a healthy weight.  Heart rate and blood pressure.  Body temperature.  Skin for abnormal spots. Counseling Your health care provider may ask you questions about your:  Past medical problems.  Family's medical history.  Alcohol, tobacco, and drug use.  Emotional well-being.  Home life and relationship well-being.  Sexual activity.  Diet, exercise, and sleep habits.  Work and work environment.  Access to firearms.  Method of birth control.  Menstrual cycle.  Pregnancy history. What immunizations do I need? Vaccines are usually given at various ages, according to a schedule. Your health care provider will recommend vaccines for you based on your age, medical history, and lifestyle or other factors, such as travel or where you work.   What tests do I need? Blood  tests  Lipid and cholesterol levels. These may be checked every 5 years, or more often if you are over 50 years old.  Hepatitis C test.  Hepatitis B test. Screening  Lung cancer screening. You may have this screening every year starting at age 55 if you have a 30-pack-year history of smoking and currently smoke or have quit within the past 15 years.  Colorectal cancer screening. ? All adults should have this screening starting at age 50 and continuing until age 75. ? Your health care provider may recommend screening at age 45 if you are at increased risk. ? You will have tests every 1-10 years, depending on your results and the type of screening test.  Diabetes screening. ? This is done by checking your blood sugar (glucose) after you have not eaten for a while (fasting). ? You may have this done every 1-3 years.  Mammogram. ? This may be done every 1-2 years. ? Talk with your health care provider about when you should start having regular mammograms. This may depend on whether you have a family history of breast cancer.  BRCA-related cancer screening. This may be done if you have a family history of breast, ovarian, tubal, or peritoneal cancers.  Pelvic exam and Pap test. ? This may be done every 3 years starting at age 21. ? Starting at age 30, this may be done every 5 years if you have a Pap test in combination with an HPV test. Other tests  STD (sexually transmitted disease) testing, if you are at risk.  Bone density scan. This is done to screen for   osteoporosis. You may have this scan if you are at high risk for osteoporosis. Talk with your health care provider about your test results, treatment options, and if necessary, the need for more tests. Follow these instructions at home: Eating and drinking  Eat a diet that includes fresh fruits and vegetables, whole grains, lean protein, and low-fat dairy products.  Take vitamin and mineral supplements as recommended by your  health care provider.  Do not drink alcohol if: ? Your health care provider tells you not to drink. ? You are pregnant, may be pregnant, or are planning to become pregnant.  If you drink alcohol: ? Limit how much you have to 0-1 drink a day. ? Be aware of how much alcohol is in your drink. In the [place] one drink equals one 12 oz bottle of beer (355 mL), one 5 oz glass of wine (148 mL), or one 1 oz glass of hard liquor (44 mL).   Lifestyle  Take daily care of your teeth and gums. Brush your teeth every morning and night with fluoride toothpaste. Floss one time each day.  Stay active. Exercise for at least 30 minutes 5 or more days each week.  Do not use any products that contain nicotine or tobacco, such as cigarettes, e-cigarettes, and chewing tobacco. If you need help quitting, ask your health care provider.  Do not use drugs.  If you are sexually active, practice safe sex. Use a condom or other form of protection to prevent STIs (sexually transmitted infections).  If you do not wish to become pregnant, use a form of birth control. If you plan to become pregnant, see your health care provider for a prepregnancy visit.  If told by your health care provider, take low-dose aspirin daily starting at age 50.  Find healthy ways to cope with stress, such as: ? Meditation, yoga, or listening to music. ? Journaling. ? Talking to a trusted person. ? Spending time with friends and family. Safety  Always wear your seat belt while driving or riding in a vehicle.  Do not drive: ? If you have been drinking alcohol. Do not ride with someone who has been drinking. ? When you are tired or distracted. ? While texting.  Wear a helmet and other protective equipment during sports activities.  If you have firearms in your house, make sure you follow all gun safety procedures. What's next?  Visit your health care provider once a year for an annual wellness visit.  Ask your health care  provider how often you should have your eyes and teeth checked.  Stay up to date on all vaccines. This information is not intended to replace advice given to you by your health care provider. Make sure you discuss any questions you have with your health care provider. Document Revised: 11/12/2019 Document Reviewed: 10/19/2017 Elsevier Patient Education  2021 Elsevier Inc.  

## 2020-05-28 LAB — CBC WITH DIFFERENTIAL/PLATELET
Basophils Absolute: 0 10*3/uL (ref 0.0–0.2)
Basos: 1 %
EOS (ABSOLUTE): 0.1 10*3/uL (ref 0.0–0.4)
Eos: 1 %
Hematocrit: 36.1 % (ref 34.0–46.6)
Hemoglobin: 10.5 g/dL — ABNORMAL LOW (ref 11.1–15.9)
Immature Grans (Abs): 0 10*3/uL (ref 0.0–0.1)
Immature Granulocytes: 0 %
Lymphocytes Absolute: 1.6 10*3/uL (ref 0.7–3.1)
Lymphs: 25 %
MCH: 21.2 pg — ABNORMAL LOW (ref 26.6–33.0)
MCHC: 29.1 g/dL — ABNORMAL LOW (ref 31.5–35.7)
MCV: 73 fL — ABNORMAL LOW (ref 79–97)
Monocytes Absolute: 0.5 10*3/uL (ref 0.1–0.9)
Monocytes: 8 %
Neutrophils Absolute: 4.3 10*3/uL (ref 1.4–7.0)
Neutrophils: 65 %
RBC: 4.96 x10E6/uL (ref 3.77–5.28)
RDW: 14.9 % (ref 11.7–15.4)
WBC: 6.5 10*3/uL (ref 3.4–10.8)

## 2020-05-28 LAB — LIPID PANEL
Chol/HDL Ratio: 3.1 ratio (ref 0.0–4.4)
Cholesterol, Total: 182 mg/dL (ref 100–199)
LDL Chol Calc (NIH): 110 mg/dL — ABNORMAL HIGH (ref 0–99)
Triglycerides: 69 mg/dL (ref 0–149)
VLDL Cholesterol Cal: 13 mg/dL (ref 5–40)

## 2020-05-28 LAB — COMPREHENSIVE METABOLIC PANEL
ALT: 12 IU/L (ref 0–32)
AST: 18 IU/L (ref 0–40)
Albumin/Globulin Ratio: 1.6 (ref 1.2–2.2)
Albumin: 4.5 g/dL (ref 3.8–4.8)
Alkaline Phosphatase: 87 IU/L (ref 44–121)
BUN/Creatinine Ratio: 15 (ref 9–23)
BUN: 16 mg/dL (ref 6–24)
Bilirubin Total: 0.6 mg/dL (ref 0.0–1.2)
CO2: 20 mmol/L (ref 20–29)
Calcium: 9.3 mg/dL (ref 8.7–10.2)
Chloride: 101 mmol/L (ref 96–106)
Creatinine, Ser: 1.05 mg/dL — ABNORMAL HIGH (ref 0.57–1.00)
Glucose: 97 mg/dL (ref 65–99)
Potassium: 4.5 mmol/L (ref 3.5–5.2)
Sodium: 137 mmol/L (ref 134–144)
Total Protein: 7.3 g/dL (ref 6.0–8.5)
eGFR: 67 mL/min/{1.73_m2} (ref >59)

## 2020-05-28 LAB — T4, FREE: Free T4: 1.11 ng/dL (ref 0.82–1.77)

## 2020-05-28 LAB — T3: T3, Total: 124 ng/dL (ref 71–180)

## 2020-05-28 LAB — HEPATITIS C ANTIBODY: Hep C Virus Ab: <0.1 s/co ratio (ref 0.0–0.9)

## 2020-05-28 LAB — TSH: TSH: 1.19 u[IU]/mL (ref 0.450–4.500)

## 2020-06-18 ENCOUNTER — Encounter: Payer: Self-pay | Admitting: Internal Medicine

## 2020-06-24 ENCOUNTER — Other Ambulatory Visit: Payer: Self-pay | Admitting: Family Medicine

## 2020-06-24 DIAGNOSIS — I10 Essential (primary) hypertension: Secondary | ICD-10-CM

## 2020-11-09 ENCOUNTER — Other Ambulatory Visit: Payer: Self-pay | Admitting: Family Medicine

## 2020-11-09 DIAGNOSIS — Z1231 Encounter for screening mammogram for malignant neoplasm of breast: Secondary | ICD-10-CM

## 2020-11-11 ENCOUNTER — Ambulatory Visit
Admission: RE | Admit: 2020-11-11 | Discharge: 2020-11-11 | Disposition: A | Discharge status: Home / Self Care | Source: Ambulatory Visit

## 2020-11-11 ENCOUNTER — Other Ambulatory Visit: Payer: Self-pay

## 2020-11-11 DIAGNOSIS — Z1231 Encounter for screening mammogram for malignant neoplasm of breast: Secondary | ICD-10-CM

## 2020-11-19 ENCOUNTER — Telehealth: Admitting: Family Medicine

## 2020-11-19 ENCOUNTER — Other Ambulatory Visit: Payer: Self-pay

## 2020-11-20 NOTE — Progress Notes (Signed)
Erroreous encounter.

## 2020-12-24 ENCOUNTER — Other Ambulatory Visit: Payer: Self-pay

## 2020-12-24 DIAGNOSIS — I10 Essential (primary) hypertension: Secondary | ICD-10-CM

## 2020-12-24 MED ORDER — NIFEDIPINE ER OSMOTIC RELEASE 60 MG PO TB24: ORAL_TABLET | ORAL | 1 refills | Status: DC

## 2021-06-02 ENCOUNTER — Encounter: Admitting: Family Medicine

## 2021-06-02 NOTE — Progress Notes (Signed)
? ?Complete physical exam ? ? ?Patient: Tonya Dominguez   DOB: 10-22-1975   46 y.o. Female  MRN: [id number] ?Visit Date: 06/03/2021 ? ?Chief Complaint  ?Patient presents with  ? Annual Exam  ?  Fasting CPE- No other concerns.  ? ?Subjective  ?  ?Tonya Dominguez is a 46 y.o. female who presents today for a complete physical exam.  ? ?Reports is generally feeling well; is eating a regular diet; is sleeping well 6 - 8 hours a night; drinks 60 - 80 ounces of water a day; is not exercising; States had Neplanon inserted by Gyn Dr. Waymon Amato on 03/25/2021 and reports weight gain and some afternoon fatigue; states she hasn't contacted her Gyn because she was advised to give her body 6 months to adjust to it. ? ?HPI ?HPI   ? ? Annual Exam   ? Additional comments: Fasting CPE- No other concerns. ? ?  ?  ?Last edited by Deforest Hoyles, [place] on 06/03/2021  8:33 AM.  ?  ?  ? ? ?Past Medical History:  ?Diagnosis Date  ? Abnormal Pap smear of cervix   ? Elevated LDL cholesterol level 05/21/2019  ? Hypertension   ? OSA (obstructive sleep apnea) 05/20/2018  ? Pap smear abnormality of cervix/human papillomavirus (HPV) positive   ? ?Past Surgical History:  ?Procedure Laterality Date  ? CESAREAN SECTION    ? ?Social History  ? ?Socioeconomic History  ? Marital status: Married  ?  Spouse name: Not on file  ? Number of children: Not on file  ? Years of education: Not on file  ? Highest education level: Not on file  ?Occupational History  ? Not on file  ?Tobacco Use  ? Smoking status: Never  ? Smokeless tobacco: Never  ?Vaping Use  ? Vaping Use: Never used  ?Substance and Sexual Activity  ? Alcohol use: Never  ? Drug use: Never  ? Sexual activity: Yes  ?  Partners: Male  ?  Birth control/protection: None  ?Other Topics Concern  ? Not on file  ?Social History Narrative  ? Not on file  ? ?Social Determinants of Health  ? ?Financial Resource Strain: Not on file  ?Food Insecurity: Not on file  ?Transportation Needs: Not on file  ?Physical Activity: Not  on file  ?Stress: Not on file  ?Social Connections: Not on file  ?Intimate Partner Violence: Not on file  ? ?Family Status  ?Relation Name Status  ? Mother  Alive  ? Father  Alive  ? Brother  Alive  ? MGF  Deceased  ? PGM  Deceased  ? PGF  Deceased  ? ?Family History  ?Problem Relation Age of Onset  ? Hypertension Mother   ? Diabetes Mother   ? Allergies Brother   ? Lung cancer Maternal Grandfather   ? ?No Known Allergies  ?Patient Care Team: ?Marcellina Millin as PCP - General (Physician Assistant) ?Waymon Amato, MD as Consulting Physician (Obstetrics and Gynecology)  ? ?Medications: ?Outpatient Medications Prior to Visit  ?Medication Sig  ? [DISCONTINUED] NIFEdipine (PROCARDIA XL/NIFEDICAL XL) 60 MG 24 hr tablet 1 daily  ? MISCELLANEOUS VAGINAL PRODUCTS [facility] vaginally. Vaginal probiotic- once a day (Patient not taking: Reported on 06/03/2021)  ? ?No facility-administered medications prior to visit.  ? ? ?Review of Systems  ?Constitutional:  Positive for unexpected weight change. Negative for activity change and fever.  ?HENT:  Negative for congestion, ear pain and voice change.   ?Eyes:  Negative for  redness.  ?Respiratory:  Negative for cough.   ?Cardiovascular:  Negative for chest pain.  ?Gastrointestinal:  Negative for constipation and diarrhea.  ?Endocrine: Negative for polyuria.  ?Genitourinary:  Negative for flank pain.  ?Musculoskeletal:  Negative for gait problem and neck stiffness.  ?Skin:  Negative for color change and rash.  ?Neurological:  Negative for dizziness.  ?Hematological:  Negative for adenopathy.  ?Psychiatric/Behavioral:  Negative for agitation, behavioral problems and confusion.   ? ?Last CBC ?Lab Results  ?Component Value Date  ? WBC 6.5 05/27/2020  ? HGB 10.5 (L) 05/27/2020  ? HCT 36.1 05/27/2020  ? MCV 73 (L) 05/27/2020  ? MCH 21.2 (L) 05/27/2020  ? RDW 14.9 05/27/2020  ? PLT 350 05/27/2020  ? ?Last metabolic panel ?Lab Results  ?Component Value Date  ? GLUCOSE 97 05/27/2020  ? NA  137 05/27/2020  ? K 4.5 05/27/2020  ? CL 101 05/27/2020  ? CO2 20 05/27/2020  ? BUN 16 05/27/2020  ? CREATININE 1.05 (H) 05/27/2020  ? EGFR 67 05/27/2020  ? CALCIUM 9.3 05/27/2020  ? PROT 7.3 05/27/2020  ? ALBUMIN 4.5 05/27/2020  ? LABGLOB 2.8 05/27/2020  ? AGRATIO 1.6 05/27/2020  ? BILITOT 0.6 05/27/2020  ? ALKPHOS 87 05/27/2020  ? AST 18 05/27/2020  ? ALT 12 05/27/2020  ? ?Last lipids ?Lab Results  ?Component Value Date  ? CHOL 182 05/27/2020  ? HDL 59 05/27/2020  ? LDLCALC 110 (H) 05/27/2020  ? TRIG 69 05/27/2020  ? CHOLHDL 3.1 05/27/2020  ? ?Last hemoglobin A1c ?Lab Results  ?Component Value Date  ? HGBA1C 5.3 04/04/2018  ? ?Last thyroid functions ?Lab Results  ?Component Value Date  ? TSH 1.190 05/27/2020  ? T3TOTAL 124 05/27/2020  ? ?  ? ?The 10-year ASCVD risk score (Arnett DK, et al., 2019) is: 2% ? ? Objective  ?  ?BP 130/80   Pulse 71   Wt 204 lb 6.4 oz (92.7 kg)   LMP 05/17/2021   SpO2 100%   BMI 33.75 kg/m?  ? ?  ? ? ?Physical Exam ?Vitals and nursing note reviewed.  ?Constitutional:   ?   General: She is not in acute distress. ?   Appearance: Normal appearance. She is not ill-appearing.  ?HENT:  ?   Head: Normocephalic and atraumatic.  ?   Right Ear: Tympanic membrane, ear canal and external ear normal.  ?   Left Ear: Tympanic membrane, ear canal and external ear normal.  ?   Nose: No congestion.  ?Eyes:  ?   Extraocular Movements: Extraocular movements intact.  ?   Conjunctiva/sclera: Conjunctivae normal.  ?   Pupils: Pupils are equal, round, and reactive to light.  ?Neck:  ?   Vascular: No carotid bruit.  ?Cardiovascular:  ?   Rate and Rhythm: Normal rate and regular rhythm.  ?   Pulses: Normal pulses.  ?   Heart sounds: Normal heart sounds.  ?Pulmonary:  ?   Effort: Pulmonary effort is normal.  ?   Breath sounds: Normal breath sounds. No wheezing.  ?Abdominal:  ?   General: Bowel sounds are normal.  ?   Palpations: Abdomen is soft.  ?Musculoskeletal:     ?   General: Normal range of motion.  ?    Cervical back: Normal range of motion and neck supple.  ?   Right lower leg: No edema.  ?   Left lower leg: No edema.  ?Skin: ?   General: Skin is warm and dry.  ?  Findings: No bruising.  ?Neurological:  ?   General: No focal deficit present.  ?   Mental Status: She is alert and oriented to person, place, and time.  ?Psychiatric:     ?   Mood and Affect: Mood normal.     ?   Behavior: Behavior normal.     ?   Thought Content: Thought content normal.  ?  ? ? ?Last depression screening scores ? ?  06/03/2021  ?  8:39 AM 05/27/2020  ?  8:28 AM 05/22/2019  ? 10:47 AM  ?PHQ 2/9 Scores  ?PHQ - 2 Score 0 0 0  ? ?Last fall risk screening ? ?  06/03/2021  ?  8:38 AM  ?Fall Risk   ?Falls in the past year? 0  ?Number falls in past yr: 0  ?Injury with Fall? 0  ?Risk for fall due to : No Fall Risks  ?Follow up Falls evaluation completed  ? ? ? ?No results found for any visits on 06/03/21. ? Assessment & Plan  ?  ?Routine Health Maintenance and Physical Exam ? ?Exercise Activities and Dietary recommendations ? Goals   ?None ?  ? ? ?Immunization History  ?Administered Date(s) Administered  ? Influenza Nasal 10/28/2010  ? Influenza Whole 01/25/2010  ? Influenza, Seasonal, Injecte, Preservative Fre 02/26/2014, 02/09/2015  ? Influenza-Unspecified 11/21/2017, 12/20/2019  ? PFIZER Comirnaty(Gray Top)Covid-19 Tri-Sucrose Vaccine 11/26/2019  ? PFIZER(Purple Top)SARS-COV-2 Vaccination 02/06/2019, 03/01/2019  ? Tdap 10/23/2008, 09/05/2014, 09/07/2014  ? ? ?Health Maintenance  ?Topic Date Due  ? COLONOSCOPY (Pts 45-18yr Insurance coverage will need to be confirmed)  Never done  ? COVID-19 Vaccine (4 - Booster for [place]) 01/17/2022 (Originally 01/21/2020)  ? HIV Screening  06/04/2022 (Originally [birth year]1991)  ? INFLUENZA VACCINE  09/21/2021  ? PAP SMEAR-Modifier  11/22/2021  ? TETANUS/TDAP  09/06/2024  ? Hepatitis C Screening  Completed  ? HPV VACCINES  Aged Out  ? ? ?Discussed health benefits of physical activity, and encouraged her to  engage in regular exercise appropriate for her age and condition. ? ?Problem List Items Addressed This Visit   ? ?  ? Cardiovascular and Mediastinum  ? Essential hypertension  ? Relevant Medications  ?

## 2021-06-03 ENCOUNTER — Encounter: Payer: Self-pay | Admitting: Physician Assistant

## 2021-06-03 ENCOUNTER — Ambulatory Visit (INDEPENDENT_AMBULATORY_CARE_PROVIDER_SITE_OTHER): Admitting: Physician Assistant

## 2021-06-03 VITALS — BP 130/80 | HR 71 | Ht 65.25 in | Wt 204.4 lb

## 2021-06-03 DIAGNOSIS — I10 Essential (primary) hypertension: Secondary | ICD-10-CM

## 2021-06-03 DIAGNOSIS — E782 Mixed hyperlipidemia: Secondary | ICD-10-CM | POA: Insufficient documentation

## 2021-06-03 DIAGNOSIS — Z0000 Encounter for general adult medical examination without abnormal findings: Secondary | ICD-10-CM

## 2021-06-03 DIAGNOSIS — Z6833 Body mass index (BMI) 33.0-33.9, adult: Secondary | ICD-10-CM

## 2021-06-03 DIAGNOSIS — D509 Iron deficiency anemia, unspecified: Secondary | ICD-10-CM | POA: Insufficient documentation

## 2021-06-03 DIAGNOSIS — D259 Leiomyoma of uterus, unspecified: Secondary | ICD-10-CM | POA: Insufficient documentation

## 2021-06-03 DIAGNOSIS — Z1211 Encounter for screening for malignant neoplasm of colon: Secondary | ICD-10-CM

## 2021-06-03 DIAGNOSIS — I8000 Phlebitis and thrombophlebitis of superficial vessels of unspecified lower extremity: Secondary | ICD-10-CM | POA: Insufficient documentation

## 2021-06-03 DIAGNOSIS — E6609 Other obesity due to excess calories: Secondary | ICD-10-CM | POA: Diagnosis not present

## 2021-06-03 MED ORDER — NIFEDIPINE ER OSMOTIC RELEASE 60 MG PO TB24: ORAL_TABLET | ORAL | 3 refills | Status: DC

## 2021-06-03 NOTE — Assessment & Plan Note (Addendum)
Stable, drink 8 - 10 glasses of water daily, limit sugar intake and eat a low fat diet, exercise 3 - 5 days a week, example walking 1 - 2 miles; patient advised to contact her Gyn to discuss weight gain after Nexplanon ? ?

## 2021-06-03 NOTE — Assessment & Plan Note (Signed)
controlled, eat a low fat diet, increase fiber intake (Benefiber or Metamucil, Cherrios,  oatmeal, beans, nuts, fruits and vegetables), limit saturated fats (in fried foods, red meat), can add OTC fish oil supplement, eat fish with Omega-3 fatty acids like salmon and tuna, exercise for 30 minutes 3 - 5 times a week, drink 8 - 10 glasses of water a day, lipid panel drawn today ? ? ?

## 2021-06-03 NOTE — Assessment & Plan Note (Signed)
controlled, continue nifedipine xl 60 mg qd , eat a low salt diet, do not add any salt to food when cooking, avoid processed foods, avoid fried foods ? ?

## 2021-06-03 NOTE — Patient Instructions (Signed)
You will get a call to schedule an appointment with Gastroenterology for a routine screening colonoscopy ? ?Preventative Care for Adults - Female ?   ?  MAINTAIN REGULAR HEALTH EXAMS: ?A routine yearly physical is a good way to check in with your primary care provider about your health and preventive screening. It is also an opportunity to share updates about your health and any concerns you have, and receive a thorough all-over exam.  ?Most health insurance companies pay for at least some preventative services.  Check with your health plan for specific coverages. ? ?WHAT PREVENTATIVE SERVICES DO WOMEN NEED? ?Adult women should have their weight and blood pressure checked regularly.  ?Women age 50 and older should have their cholesterol levels checked regularly. ?Women should be screened for cervical cancer with a Pap smear and pelvic exam beginning at either age 36, or 3 years after they become sexually activity.   ?Breast cancer screening generally begins at age 17 with a mammogram and breast exam by your primary care provider.   ?Beginning at age 76 and continuing to age 46, women should be screened for colorectal cancer.  Certain people may need continued testing until age 51. ?Updating vaccinations is part of preventative care.  Vaccinations help protect against diseases such as the flu. ?Osteoporosis is a disease in which the bones lose minerals and strength as we age. Women ages 4 and over should discuss this with their caregivers, as should women after menopause who have other risk factors. ?Lab tests are generally done as part of preventative care to screen for anemia and blood disorders, to screen for problems with the kidneys and liver, to screen for bladder problems, to check blood sugar, and to check your cholesterol level. ?Preventative services generally include counseling about diet, exercise, avoiding tobacco, drugs, excessive alcohol consumption, and sexually transmitted infections.   ? ?GENERAL  RECOMMENDATIONS FOR GOOD HEALTH: ? ?Healthy diet: ?Eat a variety of foods, including fruit, vegetables, animal or vegetable protein, such as meat, fish, chicken, and eggs, or beans, lentils, tofu, and grains, such as rice. ?Drink plenty of water daily (60 - 80 ounces or 8 - 10 glasses a day) ?Decrease saturated fat in the diet, avoid lots of red meat, processed foods, sweets, fast foods, and fried foods. For high cholesterol - Increase fiber intake (Benefiber or Metamucil, Cherrios,  oatmeal, beans, nuts, fruits and vegetables), limit saturated fats (in fried foods, red meat), can add OTC fish oil supplement, eat fish with Omega-3 fatty acids like salmon and tuna, exercise for 30 minutes 3 - 5 times a week, drink 8 - 10 glasses of water a day ? ?Exercise: ?Aerobic exercise helps maintain good heart health. At least 30-40 minutes of moderate-intensity exercise is recommended. For example, a brisk walk that increases your heart rate and breathing. This should be done on most days of the week.  ?Find a type of exercise or a variety of exercises that you enjoy so that it becomes a part of your daily life.  Examples are running, walking, swimming, water aerobics, and biking.  For motivation and support, explore group exercise such as aerobic class, spin class, Zumba, Yoga,or  martial arts, etc.   ?Set exercise goals for yourself, such as a certain weight goal, walk or run in a race such as a 5k walk/run.  Speak to your primary care provider about exercise goals. ? ?Disease prevention: ?If you smoke or chew tobacco, find out from your caregiver how to quit. It can literally  save your life, no matter how long you have been a tobacco user. If you do not use tobacco, never begin.  ?Maintain a healthy diet and normal weight. Increased weight leads to problems with blood pressure and diabetes.  ?The Body Mass Index or BMI is a way of measuring how much of your body is fat. Having a BMI above 27 increases the risk of heart  disease, diabetes, hypertension, stroke and other problems related to obesity. Your caregiver can help determine your BMI and based on it develop an exercise and dietary program to help you achieve or maintain this important measurement at a healthful level. ?High blood pressure causes heart and blood vessel problems.  Persistent high blood pressure should be treated with medicine if weight loss and exercise do not work.  ?Fat and cholesterol leaves deposits in your arteries that can block them. This causes heart disease and vessel disease elsewhere in your body.  If your cholesterol is found to be high, or if you have heart disease or certain other medical conditions, then you may need to have your cholesterol monitored frequently and be treated with medication.  ?Ask if you should have a cardiac stress test if your history suggests this. A stress test is a test done on a treadmill that looks for heart disease. This test can find disease prior to there being a problem. ?Menopause can be associated with physical symptoms and risks. Hormone replacement therapy is available to decrease these. You should talk to your caregiver about whether starting or continuing to take hormones is right for you.  ?Osteoporosis is a disease in which the bones lose minerals and strength as we age. This can result in serious bone fractures. Risk of osteoporosis can be identified using a bone density scan. Women ages 57 and over should discuss this with their caregivers, as should women after menopause who have other risk factors. Ask your caregiver whether you should be taking a calcium supplement and Vitamin D, to reduce the rate of osteoporosis.  ?Avoid drinking alcohol in excess (more than two drinks per day).  Avoid use of street drugs. Do not share needles with anyone. Ask for professional help if you need assistance or instructions on stopping the use of alcohol, cigarettes, and/or drugs. ?Brush your teeth twice a day with  fluoride toothpaste, and floss once a day. Good oral hygiene prevents tooth decay and gum disease. The problems can be painful, unattractive, and can cause other health problems. Visit your dentist for a routine oral and dental check up and preventive care every 6-12 months.  ?Look at your skin regularly.  Use a mirror to look at your back. Notify your caregivers of changes in moles, especially if there are changes in shapes, colors, a size larger than a pencil eraser, an irregular border, or development of new moles. ? ?Safety: ?Use seatbelts 100% of the time, whether driving or as a passenger.  Use safety devices such as hearing protection if you work in environments with loud noise or significant background noise.  Use safety glasses when doing any work that could send debris in to the eyes.  Use a helmet if you ride a bike or motorcycle.  Use appropriate safety gear for contact sports.  Talk to your caregiver about gun safety. ?Use sunscreen with a SPF (or skin protection factor) of 15 or greater.  Lighter skinned people are at a greater risk of skin cancer. Don?t forget to also wear sunglasses in order to protect your  eyes from too much damaging sunlight. Damaging sunlight can accelerate cataract formation.  ?Practice safe sex. Use condoms. Condoms are used for birth control and to help reduce the spread of sexually transmitted infections (or STIs).  Some of the STIs are gonorrhea (the clap), chlamydia, syphilis, trichomonas, herpes, HPV (human papilloma virus) and HIV (human immunodeficiency virus) which causes AIDS. The herpes, HIV and HPV are viral illnesses that have no cure. These can result in disability, cancer and death.  ?Keep carbon monoxide and smoke detectors in your home functioning at all times. Change the batteries every 6 months or use a model that plugs into the wall.  ? ?Vaccinations: ?Stay up to date with your tetanus shots and other required immunizations. You should have a booster for  tetanus every 10 years. Be sure to get your flu shot every year, since 5%-20% of the [place] population comes down with the flu. The flu vaccine changes each year, so being vaccinated once is not enough. Get your shot in

## 2021-06-03 NOTE — Assessment & Plan Note (Signed)
>>  ASSESSMENT AND PLAN FOR CLASS 1 OBESITY DUE TO EXCESS CALORIES WITH SERIOUS COMORBIDITY AND BODY MASS INDEX (BMI) OF 33.0 TO 33.9 IN ADULT WRITTEN ON 06/03/2021  9:30 AM BY WILLIAMS, LYNNE B, PA-C  Stable, drink 8 - 10 glasses of water daily, limit sugar intake and eat a low fat diet, exercise 3 - 5 days a week, example walking 1 - 2 miles; patient advised to contact her Gyn to discuss weight gain after Nexplanon

## 2021-06-04 LAB — COMPREHENSIVE METABOLIC PANEL
ALT: 11 IU/L (ref 0–32)
AST: 14 IU/L (ref 0–40)
Albumin/Globulin Ratio: 1.5 (ref 1.2–2.2)
Albumin: 4.5 g/dL (ref 3.8–4.8)
Alkaline Phosphatase: 78 IU/L (ref 44–121)
BUN/Creatinine Ratio: 10 (ref 9–23)
BUN: 9 mg/dL (ref 6–24)
Bilirubin Total: 0.7 mg/dL (ref 0.0–1.2)
CO2: 22 mmol/L (ref 20–29)
Calcium: 9.1 mg/dL (ref 8.7–10.2)
Chloride: 103 mmol/L (ref 96–106)
Creatinine, Ser: 0.91 mg/dL (ref 0.57–1.00)
Globulin, Total: 3 g/dL (ref 1.5–4.5)
Glucose: 87 mg/dL (ref 70–99)
Potassium: 4.5 mmol/L (ref 3.5–5.2)
Sodium: 138 mmol/L (ref 134–144)
Total Protein: 7.5 g/dL (ref 6.0–8.5)
eGFR: 79 mL/min/{1.73_m2} (ref >59)

## 2021-06-04 LAB — LIPID PANEL
Chol/HDL Ratio: 2.8 ratio (ref 0.0–4.4)
Cholesterol, Total: 193 mg/dL (ref 100–199)
HDL: 69 mg/dL (ref >39)
LDL Chol Calc (NIH): 109 mg/dL — ABNORMAL HIGH (ref 0–99)
Triglycerides: 85 mg/dL (ref 0–149)
VLDL Cholesterol Cal: 15 mg/dL (ref 5–40)

## 2021-06-04 LAB — CBC WITH DIFFERENTIAL/PLATELET
Basophils Absolute: 0.1 10*3/uL (ref 0.0–0.2)
Basos: 1 %
EOS (ABSOLUTE): 0.1 10*3/uL (ref 0.0–0.4)
Eos: 1 %
Hematocrit: 37.8 % (ref 34.0–46.6)
Hemoglobin: 10.9 g/dL — ABNORMAL LOW (ref 11.1–15.9)
Immature Grans (Abs): 0.1 10*3/uL (ref 0.0–0.1)
Immature Granulocytes: 1 %
Lymphocytes Absolute: 1.6 10*3/uL (ref 0.7–3.1)
Lymphs: 20 %
MCH: 21.2 pg — ABNORMAL LOW (ref 26.6–33.0)
MCHC: 28.8 g/dL — ABNORMAL LOW (ref 31.5–35.7)
MCV: 74 fL — ABNORMAL LOW (ref 79–97)
Monocytes Absolute: 0.7 10*3/uL (ref 0.1–0.9)
Monocytes: 9 %
Neutrophils Absolute: 5.4 10*3/uL (ref 1.4–7.0)
Neutrophils: 68 %
Platelets: 318 10*3/uL (ref 150–450)
RBC: 5.13 x10E6/uL (ref 3.77–5.28)
RDW: 15.5 % — ABNORMAL HIGH (ref 11.7–15.4)
WBC: 7.9 10*3/uL (ref 3.4–10.8)

## 2021-06-25 ENCOUNTER — Other Ambulatory Visit: Payer: Self-pay | Admitting: Medical

## 2021-06-25 DIAGNOSIS — I10 Essential (primary) hypertension: Secondary | ICD-10-CM

## 2021-07-14 ENCOUNTER — Encounter: Payer: Self-pay | Admitting: Gastroenterology

## 2021-07-14 ENCOUNTER — Telehealth: Payer: Self-pay | Admitting: Physician Assistant

## 2021-07-14 NOTE — Telephone Encounter (Signed)
Referral followup 

## 2021-07-28 ENCOUNTER — Ambulatory Visit (AMBULATORY_SURGERY_CENTER): Payer: Self-pay

## 2021-07-28 ENCOUNTER — Other Ambulatory Visit: Payer: Self-pay | Admitting: Internal Medicine

## 2021-07-28 VITALS — Ht 65.0 in | Wt 205.0 lb

## 2021-07-28 DIAGNOSIS — Z1211 Encounter for screening for malignant neoplasm of colon: Secondary | ICD-10-CM

## 2021-07-28 MED ORDER — CLENPIQ 10-3.5-12 MG-GM -GM/160ML PO SOLN: ORAL | 0 refills | Status: DC

## 2021-07-28 NOTE — Progress Notes (Signed)
No egg or soy allergy known to patient  No issues known to pt with past sedation with any surgeries or procedures Patient denies ever being told they had issues or difficulty with intubation  No FH of Malignant Hyperthermia Pt is not on diet pills Pt is not on home 02  Pt is not on blood thinners  Pt denies issues with constipation at this time;  No A fib or A flutter Coupon to pt in PV today NO PA's for preps discussed with pt in PV today  Discussed with pt there will be an out-of-pocket cost for prep and that varies from $0 to 70 + dollars - pt verbalized understanding  Pt instructed to use Singlecare.com or GoodRx for a price reduction on prep  Pt verified name, DOB, address and insurance during PV today.  Pt given instruction packet to read and not return.  Pt encouraged to call with questions or issues.  Pt has My chart, procedure instructions sent via My Chart

## 2021-08-19 ENCOUNTER — Ambulatory Visit (AMBULATORY_SURGERY_CENTER): Admitting: Gastroenterology

## 2021-08-19 ENCOUNTER — Encounter: Payer: Self-pay | Admitting: Gastroenterology

## 2021-08-19 VITALS — BP 121/69 | HR 59 | Temp 96.9°F | Resp 13 | Ht 65.0 in | Wt 205.0 lb

## 2021-08-19 DIAGNOSIS — Z1211 Encounter for screening for malignant neoplasm of colon: Secondary | ICD-10-CM | POA: Diagnosis present

## 2021-08-19 MED ORDER — SODIUM CHLORIDE 0.9 % IV SOLN: 500.0000 mL | Freq: Once | INTRAVENOUS | Status: DC

## 2021-08-19 NOTE — Progress Notes (Signed)
[facility] History and Physical   Primary Care Physician:  Marcellina Millin   Reason for Procedure:   Colon cancer screening  Plan:    Screening colonoscopy     HPI: Tonya Dominguez is a 46 y.o. female undergoing initial average risk screening colonoscopy.  She has no family history of colon cancer and no chronic GI symptoms.   Her father had polyps on his most recent colonoscopy (details unknown).   Past Medical History:  Diagnosis Date   Abnormal Pap smear of cervix    Anemia    Blood transfusion without reported diagnosis 2016   Elevated LDL cholesterol level 05/21/2019   GERD (gastroesophageal reflux disease)    hx of   Hypertension    meds   OSA (obstructive sleep apnea) 05/20/2018   uses CPAP   Pap smear abnormality of cervix/human papillomavirus (HPV) positive     Past Surgical History:  Procedure Laterality Date   CESAREAN SECTION  2016   CESAREAN SECTION  2010   WISDOM TOOTH EXTRACTION  2008    Prior to Admission medications   Medication Sig Start Date End Date Taking? Authorizing Provider  Cholecalciferol (VITAMIN D3 PO) Take 1 tablet by mouth daily at 6 (six) AM.   Yes [provider]  cyanocobalamin 1000 MCG tablet Take 1,000 mcg by mouth daily. GUMMY   Yes [provider]  Multiple Vitamin (MULTIVITAMIN PO) Take 1 tablet by mouth daily at 6 (six) AM.   Yes [provider]  NIFEdipine (PROCARDIA XL/NIFEDICAL XL) 60 MG 24 hr tablet 1 daily 06/03/21  Yes Irene Pap, PA-C  etonogestrel (NEXPLANON) 68 MG IMPL implant  03/25/21   [provider]  ibuprofen (ADVIL) 800 MG tablet Take 800 mg by mouth 3 (three) times daily. Patient not taking: Reported on 08/19/2021 08/17/21   [provider]    Current Outpatient Medications  Medication Sig Dispense Refill   Cholecalciferol (VITAMIN D3 PO) Take 1 tablet by mouth daily at 6 (six) AM.     cyanocobalamin 1000 MCG tablet Take 1,000 mcg by mouth  daily. GUMMY     Multiple Vitamin (MULTIVITAMIN PO) Take 1 tablet by mouth daily at 6 (six) AM.     NIFEdipine (PROCARDIA XL/NIFEDICAL XL) 60 MG 24 hr tablet 1 daily 90 tablet 3   etonogestrel (NEXPLANON) 68 MG IMPL implant      ibuprofen (ADVIL) 800 MG tablet Take 800 mg by mouth 3 (three) times daily. (Patient not taking: Reported on 08/19/2021)     Current Facility-Administered Medications  Medication Dose Route Frequency Provider Last Rate Last Admin   0.9 %  sodium chloride infusion  500 mL Intravenous Once Daryel November, MD        Allergies as of 08/19/2021   (No Known Allergies)    Family History  Problem Relation Age of Onset   Hypertension Mother    Diabetes Mother    Colon polyps Father    Allergies Brother    Lung cancer Maternal Grandfather    Colon cancer Neg Hx    Esophageal cancer Neg Hx    Rectal cancer Neg Hx    Stomach cancer Neg Hx     Social History   Socioeconomic History   Marital status: Married    Spouse name: Not on file   Number of children: Not on file   Years of education: Not on file   Highest education level: Not on file  Occupational History  Not on file  Tobacco Use   Smoking status: Never   Smokeless tobacco: Never  Vaping Use   Vaping Use: Never used  Substance and Sexual Activity   Alcohol use: Not Currently    Alcohol/week: 0.0 - 1.0 standard drinks of alcohol   Drug use: Never   Sexual activity: Yes    Partners: Male    Birth control/protection: None  Other Topics Concern   Not on file  Social History Narrative   Not on file   Social Determinants of Health   Financial Resource Strain: Not on file  Food Insecurity: Not on file  Transportation Needs: Not on file  Physical Activity: Not on file  Stress: Not on file  Social Connections: Not on file  Intimate Partner Violence: Not on file    Review of Systems:  All other review of systems negative except as mentioned in the HPI.  Physical Exam: Vital  signs BP 109/70   Pulse 66   Temp (!) 96.9 F (36.1 C)   Ht '5\' 5"'$  (1.651 m)   Wt 205 lb (93 kg)   SpO2 99%   BMI 34.11 kg/m   General:   Alert,  Well-developed, well-nourished, pleasant and cooperative in NAD Airway:  Mallampati 1 Lungs:  Clear throughout to auscultation.   Heart:  Regular rate and rhythm; no murmurs, clicks, rubs,  or gallops. Abdomen:  Soft, nontender and nondistended. Normal bowel sounds.   Neuro/Psych:  Normal mood and affect. A and O x 3   Gary Gabrielsen E. Candis Schatz, MD [facility]

## 2021-08-19 NOTE — Patient Instructions (Signed)
Resume previous diet and medications.  Repeat colonoscopy in 10 years for screening purposes.   YOU HAD AN ENDOSCOPIC PROCEDURE TODAY AT [facility]:   Refer to the procedure report that was given to you for any specific questions about what was found during the examination.  If the procedure report does not answer your questions, please call your gastroenterologist to clarify.  If you requested that your care partner not be given the details of your procedure findings, then the procedure report has been included in a sealed envelope for you to review at your convenience later.  YOU SHOULD EXPECT: Some feelings of bloating in the abdomen. Passage of more gas than usual.  Walking can help get rid of the air that was put into your GI tract during the procedure and reduce the bloating. If you had a lower endoscopy (such as a colonoscopy or flexible sigmoidoscopy) you may notice spotting of blood in your stool or on the toilet paper. If you underwent a bowel prep for your procedure, you may not have a normal bowel movement for a few days.  Please Note:  You might notice some irritation and congestion in your nose or some drainage.  This is from the oxygen used during your procedure.  There is no need for concern and it should clear up in a day or so.  SYMPTOMS TO REPORT IMMEDIATELY:  Following lower endoscopy (colonoscopy or flexible sigmoidoscopy):  Excessive amounts of blood in the stool  Significant tenderness or worsening of abdominal pains  Swelling of the abdomen that is new, acute  Fever of 100F or higher   For urgent or emergent issues, a gastroenterologist can be reached at any hour by calling [phone]. Do not use MyChart messaging for urgent concerns.    DIET:  We do recommend a small meal at first, but then you may proceed to your regular diet.  Drink plenty of fluids but you should avoid alcoholic beverages for 24 hours.  ACTIVITY:  You should plan to take it  easy for the rest of today and you should NOT DRIVE or use heavy machinery until tomorrow (because of the sedation medicines used during the test).    FOLLOW UP: Our staff will call the number listed on your records the next business day following your procedure.  We will call around 7:15- 8:00 am to check on you and address any questions or concerns that you may have regarding the information given to you following your procedure. If we do not reach you, we will leave a message.  If you develop any symptoms (ie: fever, flu-like symptoms, shortness of breath, cough etc.) before then, please call [phone].  If you test positive for Covid 19 in the 2 weeks post procedure, please call and report this information to [place].    If any biopsies were taken you will be contacted by phone or by letter within the next 1-3 weeks.  Please call us at [phone] if you have not heard about the biopsies in 3 weeks.    SIGNATURES/CONFIDENTIALITY: You and/or your care partner have signed paperwork which will be entered into your electronic medical record.  These signatures attest to the fact that that the information above on your After Visit Summary has been reviewed and is understood.  Full responsibility of the confidentiality of this discharge information lies with you and/or your care-partner.

## 2021-08-19 NOTE — Op Note (Signed)
[facility] Patient Name: Tonya Dominguez Procedure Date: 08/19/2021 10:51 AM MRN: [id number] Endoscopist: Nicki Reaper E. Candis Schatz , MD Age: 46 Referring MD:  Date of Birth: November 25, 1975 Gender: Female Account #: [id number] Procedure:                Colonoscopy Indications:              Screening for colorectal malignant neoplasm, This                            is the patient's first colonoscopy Medicines:                Monitored Anesthesia Care Procedure:                Pre-Anesthesia Assessment:                           - Prior to the procedure, a History and Physical                            was performed, and patient medications and                            allergies were reviewed. The patient's tolerance of                            previous anesthesia was also reviewed. The risks                            and benefits of the procedure and the sedation                            options and risks were discussed with the patient.                            All questions were answered, and informed consent                            was obtained. Prior Anticoagulants: The patient has                            taken no previous anticoagulant or antiplatelet                            agents. ASA Grade Assessment: II - A patient with                            mild systemic disease. After reviewing the risks                            and benefits, the patient was deemed in                            satisfactory condition to undergo the procedure.  After obtaining informed consent, the colonoscope                            was passed under direct vision. Throughout the                            procedure, the patient's blood pressure, pulse, and                            oxygen saturations were monitored continuously. The                            Olympus CF-HQ190L ([id number]) Colonoscope was                            introduced through the  anus and advanced to the the                            terminal ileum, with identification of the                            appendiceal orifice and IC valve. The colonoscopy                            was performed without difficulty. The patient                            tolerated the procedure well. The quality of the                            bowel preparation was adequate. The ileocecal                            valve, appendiceal orifice, and rectum were                            photographed. The bowel preparation used was                            Clenpiq via split dose instruction. Scope In: 10:59:57 AM Scope Out: 11:18:30 AM Scope Withdrawal Time: 0 hours 12 minutes 4 seconds  Total Procedure Duration: 0 hours 18 minutes 33 seconds  Findings:                 Skin tags were found on perianal exam.                           The digital rectal exam was normal. Pertinent                            negatives include normal sphincter tone and no                            palpable rectal lesions.  The colon (entire examined portion) appeared normal.                           The retroflexed view of the distal rectum and anal                            verge was normal and showed no anal or rectal                            abnormalities. Complications:            No immediate complications. Estimated Blood Loss:     Estimated blood loss: none. Impression:               - Perianal skin tags found on perianal exam.                           - The entire examined colon is normal.                           - The distal rectum and anal verge are normal on                            retroflexion view.                           - No specimens collected. Recommendation:           - Patient has a contact number available for                            emergencies. The signs and symptoms of potential                            delayed complications were  discussed with the                            patient. Return to normal activities tomorrow.                            Written discharge instructions were provided to the                            patient.                           - Resume previous diet.                           - Continue present medications.                           - Repeat colonoscopy in 10 years for screening                            purposes. Fred Franzen E. Candis Schatz, MD 08/19/2021 11:24:50 AM This report has been signed electronically.

## 2021-08-19 NOTE — Progress Notes (Signed)
To pacu, VSS. Report to [email or web address] 

## 2021-08-20 ENCOUNTER — Encounter: Payer: Self-pay | Admitting: Internal Medicine

## 2021-08-20 ENCOUNTER — Telehealth: Payer: Self-pay | Admitting: *Deleted

## 2021-08-20 NOTE — Telephone Encounter (Signed)
  Follow up Call-     08/19/2021   10:09 AM  Call back number  Post procedure Call Back phone  # [phone]  Permission to leave phone message Yes     Patient questions:  Do you have a fever, pain , or abdominal swelling? No. Pain Score  0 *  Have you tolerated food without any problems? Yes.    Have you been able to return to your normal activities? Yes.    Do you have any questions about your discharge instructions: Diet   No. Medications  No. Follow up visit  No.  Do you have questions or concerns about your Care? No.  Actions: * If pain score is 4 or above: No action needed, pain <4.

## 2021-10-27 ENCOUNTER — Encounter: Payer: Self-pay | Admitting: Internal Medicine

## 2021-11-30 ENCOUNTER — Encounter: Payer: Self-pay | Admitting: Internal Medicine

## 2022-01-04 ENCOUNTER — Encounter: Payer: Self-pay | Admitting: Internal Medicine

## 2022-02-22 ENCOUNTER — Encounter: Payer: Self-pay | Admitting: Family Medicine

## 2022-02-22 ENCOUNTER — Telehealth: Payer: Self-pay | Admitting: Family Medicine

## 2022-02-22 DIAGNOSIS — U071 COVID-19: Secondary | ICD-10-CM

## 2022-02-22 MED ORDER — MOLNUPIRAVIR EUA 200MG CAPSULE: 4.0000 | ORAL_CAPSULE | Freq: Two times a day (BID) | ORAL | 0 refills | Status: AC

## 2022-02-22 NOTE — Patient Instructions (Addendum)
Tonya Dominguez, thank you for joining Perlie Mayo, NP for today's virtual visit.  While this provider is not your primary care provider (PCP), if your PCP is located in our provider database this encounter information will be shared with them immediately following your visit.   [facility] account gives you access to today's visit and all your visits, tests, and labs performed at [facility] " click here if you don't have a [facility] account or go to [email or web address]  Consent: (Patient) Tonya Dominguez provided verbal consent for this virtual visit at the beginning of the encounter.  Current Medications:  Current Outpatient Medications:    molnupiravir EUA (LAGEVRIO) 200 mg CAPS capsule, Take 4 capsules (800 mg total) by mouth 2 (two) times daily for 5 days., Disp: 40 capsule, Rfl: 0   Cholecalciferol (VITAMIN D3 PO), Take 1 tablet by mouth daily at 6 (six) AM., Disp: , Rfl:    cyanocobalamin 1000 MCG tablet, Take 1,000 mcg by mouth daily. GUMMY, Disp: , Rfl:    etonogestrel (NEXPLANON) 68 MG IMPL implant, , Disp: , Rfl:    ibuprofen (ADVIL) 800 MG tablet, Take 800 mg by mouth 3 (three) times daily. (Patient not taking: Reported on 08/19/2021), Disp: , Rfl:    Multiple Vitamin (MULTIVITAMIN PO), Take 1 tablet by mouth daily at 6 (six) AM., Disp: , Rfl:    NIFEdipine (PROCARDIA XL/NIFEDICAL XL) 60 MG 24 hr tablet, 1 daily, Disp: 90 tablet, Rfl: 3   Medications ordered in this encounter:  Meds ordered this encounter  Medications   molnupiravir EUA (LAGEVRIO) 200 mg CAPS capsule    Sig: Take 4 capsules (800 mg total) by mouth 2 (two) times daily for 5 days.    Dispense:  40 capsule    Refill:  0    Order Specific Question:   Supervising Provider    Answer:   Chase Picket [id number]     *If you need refills on other medications prior to your next appointment, please contact your pharmacy*  Follow-Up: Call back or seek an in-person evaluation  if the symptoms worsen or if the condition fails to improve as anticipated.  [place] [phone]  Other Instructions  - Continue OTC symptomatic management of choice - Will send OTC vitamins and supplement information through AVS - Patient enrolled in MyChart symptom monitoring - Push fluids - Rest as needed   Please keep well-hydrated and get plenty of rest. Start a saline nasal rinse to flush out your nasal passages. You can use plain Mucinex to help thin congestion. If you have a humidifier, running in the bedroom at night. I want you to start OTC vitamin D3 1000 units daily, vitamin C 1000 mg daily, and a zinc supplement. Please take prescribed medications as directed.  You have been enrolled in a MyChart symptom monitoring program. Please answer these questions daily so we can keep track of how you are doing.  You were to quarantine for 5 days from onset of your symptoms.  After day 5, if you have had no fever and you are feeling better, you can end quarantine but need to mask for an additional 5 days. After day 5 if you have a fever or are having significant symptoms, please quarantine for full 10 days.  If you note any worsening of symptoms, any significant shortness of breath or any chest pain, please seek ER evaluation ASAP.  Please do not delay care!  COVID-19:  What to Do if You Are Sick If you test positive and are an older adult or someone who is at high risk of getting very sick from COVID-19, treatment may be available. Contact a healthcare provider right away after a positive test to determine if you are eligible, even if your symptoms are mild right now. You can also visit a Test to Treat location and, if eligible, receive a prescription from a provider. Don't delay: Treatment must be started within the first few days to be effective. If you have a fever, cough, or other symptoms, you might have COVID-19. Most people have mild illness and are able to  recover at home. If you are sick: Keep track of your symptoms. If you have an emergency warning sign (including trouble breathing), call 911. Steps to help prevent the spread of COVID-19 if you are sick If you are sick with COVID-19 or think you might have COVID-19, follow the steps below to care for yourself and to help protect other people in your home and community. Stay home except to get medical care Stay home. Most people with COVID-19 have mild illness and can recover at home without medical care. Do not leave your home, except to get medical care. Do not visit public areas and do not go to places where you are unable to wear a mask. Take care of yourself. Get rest and stay hydrated. Take over-the-counter medicines, such as acetaminophen, to help you feel better. Stay in touch with your doctor. Call before you get medical care. Be sure to get care if you have trouble breathing, or have any other emergency warning signs, or if you think it is an emergency. Avoid public transportation, ride-sharing, or taxis if possible. Get tested If you have symptoms of COVID-19, get tested. While waiting for test results, stay away from others, including staying apart from those living in your household. Get tested as soon as possible after your symptoms start. Treatments may be available for people with COVID-19 who are at risk for becoming very sick. Don't delay: Treatment must be started early to be effective--some treatments must begin within 5 days of your first symptoms. Contact your healthcare provider right away if your test result is positive to determine if you are eligible. Self-tests are one of several options for testing for the virus that causes COVID-19 and may be more convenient than laboratory-based tests and point-of-care tests. Ask your healthcare provider or your local health department if you need help interpreting your test results. You can visit your state, tribal, local, and territorial  health department's website to look for the latest local information on testing sites. Separate yourself from other people As much as possible, stay in a specific room and away from other people and pets in your home. If possible, you should use a separate bathroom. If you need to be around other people or animals in or outside of the home, wear a well-fitting mask. Tell your close contacts that they may have been exposed to COVID-19. An infected person can spread COVID-19 starting 48 hours (or 2 days) before the person has any symptoms or tests positive. By letting your close contacts know they may have been exposed to COVID-19, you are helping to protect everyone. See COVID-19 and Animals if you have questions about pets. If you are diagnosed with COVID-19, someone from the health department may call you. Answer the call to slow the spread. Monitor your symptoms Symptoms of COVID-19 include fever, cough, or other  symptoms. Follow care instructions from your healthcare provider and local health department. Your local health authorities may give instructions on checking your symptoms and reporting information. When to seek emergency medical attention Look for emergency warning signs* for COVID-19. If someone is showing any of these signs, seek emergency medical care immediately: Trouble breathing Persistent pain or pressure in the chest New confusion Inability to wake or stay awake Pale, gray, or blue-colored skin, lips, or nail beds, depending on skin tone *This list is not all possible symptoms. Please call your medical provider for any other symptoms that are severe or concerning to you. Call 911 or call ahead to your local emergency facility: Notify the operator that you are seeking care for someone who has or may have COVID-19. Call ahead before visiting your doctor Call ahead. Many medical visits for routine care are being postponed or done by phone or telemedicine. If you have a medical  appointment that cannot be postponed, call your doctor's office, and tell them you have or may have COVID-19. This will help the office protect themselves and other patients. If you are sick, wear a well-fitting mask You should wear a mask if you must be around other people or animals, including pets (even at home). Wear a mask with the best fit, protection, and comfort for you. You don't need to wear the mask if you are alone. If you can't put on a mask (because of trouble breathing, for example), cover your coughs and sneezes in some other way. Try to stay at least 6 feet away from other people. This will help protect the people around you. Masks should not be placed on young children under age 57 years, anyone who has trouble breathing, or anyone who is not able to remove the mask without help. Cover your coughs and sneezes Cover your mouth and nose with a tissue when you cough or sneeze. Throw away used tissues in a lined trash can. Immediately wash your hands with soap and water for at least 20 seconds. If soap and water are not available, clean your hands with an alcohol-based hand sanitizer that contains at least 60% alcohol. Clean your hands often Wash your hands often with soap and water for at least 20 seconds. This is especially important after blowing your nose, coughing, or sneezing; going to the bathroom; and before eating or preparing food. Use hand sanitizer if soap and water are not available. Use an alcohol-based hand sanitizer with at least 60% alcohol, covering all surfaces of your hands and rubbing them together until they feel dry. Soap and water are the best option, especially if hands are visibly dirty. Avoid touching your eyes, nose, and mouth with unwashed hands. Handwashing Tips Avoid sharing personal household items Do not share dishes, drinking glasses, cups, eating utensils, towels, or bedding with other people in your home. Wash these items thoroughly after using them  with soap and water or put in the dishwasher. Clean surfaces in your home regularly Clean and disinfect high-touch surfaces (for example, doorknobs, tables, handles, light switches, and countertops) in your "sick room" and bathroom. In shared spaces, you should clean and disinfect surfaces and items after each use by the person who is ill. If you are sick and cannot clean, a caregiver or other person should only clean and disinfect the area around you (such as your bedroom and bathroom) on an as needed basis. Your caregiver/other person should wait as long as possible (at least several hours) and wear a  mask before entering, cleaning, and disinfecting shared spaces that you use. Clean and disinfect areas that may have blood, stool, or body fluids on them. Use household cleaners and disinfectants. Clean visible dirty surfaces with household cleaners containing soap or detergent. Then, use a household disinfectant. Use a product from [employer] List N: Disinfectants for Coronavirus (OZYYQ-82). Be sure to follow the instructions on the label to ensure safe and effective use of the product. Many products recommend keeping the surface wet with a disinfectant for a certain period of time (look at "contact time" on the product label). You may also need to wear personal protective equipment, such as gloves, depending on the directions on the product label. Immediately after disinfecting, wash your hands with soap and water for 20 seconds. For completed guidance on cleaning and disinfecting your home, visit Complete Disinfection Guidance. Take steps to improve ventilation at home Improve ventilation (air flow) at home to help prevent from spreading COVID-19 to other people in your household. Clear out COVID-19 virus particles in the air by opening windows, using air filters, and turning on fans in your home. Use this interactive tool to learn how to improve air flow in your home. When you can be around others after  being sick with COVID-19 Deciding when you can be around others is different for different situations. Find out when you can safely end home isolation. For any additional questions about your care, contact your healthcare provider or state or local health department. 05/12/2020 Content source: [facility] ([facility]), Division of Viral Diseases This information is not intended to replace advice given to you by your health care provider. Make sure you discuss any questions you have with your health care provider. Document Revised: 06/25/2020 Document Reviewed: 06/25/2020 Elsevier Patient Education  2022 [employer].      If you have been instructed to have an in-person evaluation today at a local Urgent Care facility, please use the link below. It will take you to a list of all of our available [facility], including address, phone number and hours of operation. Please do not delay care.  [facility]  If you or a family member do not have a primary care provider, use the link below to schedule a visit and establish care. When you choose a  primary care physician or advanced practice provider, you gain a long-term partner in health. Find a Primary Care Provider  Learn more about 's in-office and virtual care options: [facility]

## 2022-02-22 NOTE — Progress Notes (Signed)
Virtual Visit Consent   [place], you are scheduled for a virtual visit with a [facility] provider today. Just as with appointments in the office, your consent must be obtained to participate. Your consent will be active for this visit and any virtual visit you may have with one of our providers in the next 365 days. If you have a MyChart account, a copy of this consent can be sent to you electronically.  As this is a virtual visit, video technology does not allow for your provider to perform a traditional examination. This may limit your provider's ability to fully assess your condition. If your provider identifies any concerns that need to be evaluated in person or the need to arrange testing (such as labs, EKG, etc.), we will make arrangements to do so. Although advances in technology are sophisticated, we cannot ensure that it will always work on either your end or our end. If the connection with a video visit is poor, the visit may have to be switched to a telephone visit. With either a video or telephone visit, we are not always able to ensure that we have a secure connection.  By engaging in this virtual visit, you consent to the provision of healthcare and authorize for your insurance to be billed (if applicable) for the services provided during this visit. Depending on your insurance coverage, you may receive a charge related to this service.  I need to obtain your verbal consent now. Are you willing to proceed with your visit today? Felica R Dorman has provided verbal consent on 02/22/2022 for a virtual visit (video or telephone). Perlie Mayo, NP  Date: 02/22/2022 10:30 AM  Virtual Visit via Video Note   I, Perlie Mayo, connected with  JEIRY BIRNBAUM  ([id number], [birth year]) on 02/22/22 at 10:30 AM EST by a video-enabled telemedicine application and verified that I am speaking with the correct person using two identifiers.  Location: Patient: Virtual Visit Location Patient:  Home Provider: Virtual Visit Location Provider: Home Office   I discussed the limitations of evaluation and management by telemedicine and the availability of in person appointments. The patient expressed understanding and agreed to proceed.    History of Present Illness: Tonya Dominguez is a 47 y.o. who identifies as a female who was assigned female at birth, and is being seen today for COVID + yesterday- symptoms started on Sunday 02/20/22- sore throat and progressed to aches on Monday 02/21/2202. Associated symptoms include cough, runny nose, mild shortness of breath when going up steps. Denies fevers, chest pain. Modifying factors- mucinex, zicam.   Problems:  Patient Active Problem List   Diagnosis Date Noted   Iron deficiency anemia 06/03/2021   Leiomyoma of uterus 06/03/2021   Phlebitis and thrombophlebitis of superficial vessels of lower extremities 06/03/2021   Mixed hyperlipidemia 06/03/2021   Class 1 obesity due to excess calories with serious comorbidity and body mass index (BMI) of 33.0 to 33.9 in adult 06/03/2021   OSA (obstructive sleep apnea) 05/20/2018   Essential hypertension 04/04/2018   Obesity (BMI 30-39.9) 04/04/2018   History of colposcopy 04/04/2018   Family history of diabetes mellitus in first degree relative 04/04/2018   Snoring 04/04/2018   Excessive daytime sleepiness 04/04/2018   Pap smear abnormality of cervix/human papillomavirus (HPV) positive     Allergies: No Known Allergies Medications:  Current Outpatient Medications:    Cholecalciferol (VITAMIN D3 PO), Take 1 tablet by mouth daily at 6 (six) AM., Disp: ,  Rfl:    cyanocobalamin 1000 MCG tablet, Take 1,000 mcg by mouth daily. GUMMY, Disp: , Rfl:    etonogestrel (NEXPLANON) 68 MG IMPL implant, , Disp: , Rfl:    ibuprofen (ADVIL) 800 MG tablet, Take 800 mg by mouth 3 (three) times daily. (Patient not taking: Reported on 08/19/2021), Disp: , Rfl:    Multiple Vitamin (MULTIVITAMIN PO), Take 1 tablet by  mouth daily at 6 (six) AM., Disp: , Rfl:    NIFEdipine (PROCARDIA XL/NIFEDICAL XL) 60 MG 24 hr tablet, 1 daily, Disp: 90 tablet, Rfl: 3  Observations/Objective: Patient is well-developed, well-nourished in no acute distress.  Resting comfortably  at home.  Head is normocephalic, atraumatic.  No labored breathing.  Speech is clear and coherent with logical content.  Patient is alert and oriented at baseline.    Assessment and Plan: 1. COVID  - molnupiravir EUA (LAGEVRIO) 200 mg CAPS capsule; Take 4 capsules (800 mg total) by mouth 2 (two) times daily for 5 days.  Dispense: 40 capsule; Refill: 0  -Take meds as prescribed -Flonase, Mucinex -Rest -Use a cool mist humidifier especially during the winter months when heat dries out the air. - Use saline nose sprays frequently to help soothe nasal passages and promote drainage. -stay hydrated by drinking plenty of fluids - Keep thermostat turn down low to prevent drying out sinuses - For any cough or congestion- robitussin DM or Delsym as needed - For fever or aches or pains- take tylenol or ibuprofen as directed on bottle             * for fevers greater than 101 orally you may alternate ibuprofen and tylenol every 3 hours.  - Continue OTC symptomatic management of choice - Will send OTC vitamins and supplement information through AVS - Patient enrolled in MyChart symptom monitoring - Push fluids - Rest as needed - Discussed return precautions and when to seek in-person evaluation, sent via AVS as well   If you do not improve you will need a follow up visit in person.                Reviewed side effects, risks and benefits of medication.    Patient acknowledged agreement and understanding of the plan.   Past Medical, Surgical, Social History, Allergies, and Medications have been Reviewed.     Follow Up Instructions: I discussed the assessment and treatment plan with the patient. The patient was provided an opportunity to  ask questions and all were answered. The patient agreed with the plan and demonstrated an understanding of the instructions.  A copy of instructions were sent to the patient via MyChart unless otherwise noted below.     The patient was advised to call back or seek an in-person evaluation if the symptoms worsen or if the condition fails to improve as anticipated.  Time:  I spent 10 minutes with the patient via telehealth technology discussing the above problems/concerns.    Perlie Mayo, NP

## 2022-06-16 ENCOUNTER — Encounter: Admitting: Physician Assistant

## 2022-06-26 ENCOUNTER — Other Ambulatory Visit: Payer: Self-pay | Admitting: Physician Assistant

## 2022-06-26 DIAGNOSIS — I10 Essential (primary) hypertension: Secondary | ICD-10-CM

## 2022-09-23 ENCOUNTER — Other Ambulatory Visit: Payer: Self-pay | Admitting: Nurse Practitioner

## 2022-09-23 DIAGNOSIS — I10 Essential (primary) hypertension: Secondary | ICD-10-CM

## 2022-09-29 ENCOUNTER — Ambulatory Visit (INDEPENDENT_AMBULATORY_CARE_PROVIDER_SITE_OTHER): Admitting: Nurse Practitioner

## 2022-09-29 ENCOUNTER — Encounter: Payer: Self-pay | Admitting: Nurse Practitioner

## 2022-09-29 VITALS — BP 124/74 | HR 81 | Ht 65.5 in | Wt 230.4 lb

## 2022-09-29 DIAGNOSIS — E669 Obesity, unspecified: Secondary | ICD-10-CM

## 2022-09-29 DIAGNOSIS — Z0000 Encounter for general adult medical examination without abnormal findings: Secondary | ICD-10-CM

## 2022-09-29 DIAGNOSIS — R600 Localized edema: Secondary | ICD-10-CM | POA: Insufficient documentation

## 2022-09-29 DIAGNOSIS — D509 Iron deficiency anemia, unspecified: Secondary | ICD-10-CM | POA: Diagnosis not present

## 2022-09-29 DIAGNOSIS — I10 Essential (primary) hypertension: Secondary | ICD-10-CM

## 2022-09-29 DIAGNOSIS — E782 Mixed hyperlipidemia: Secondary | ICD-10-CM

## 2022-09-29 DIAGNOSIS — E538 Deficiency of other specified B group vitamins: Secondary | ICD-10-CM

## 2022-09-29 DIAGNOSIS — E559 Vitamin D deficiency, unspecified: Secondary | ICD-10-CM

## 2022-09-29 LAB — COMPREHENSIVE METABOLIC PANEL
ALT: 17 IU/L (ref 0–32)
AST: 15 IU/L (ref 0–40)
Albumin: 4.2 g/dL (ref 3.9–4.9)
Alkaline Phosphatase: 84 IU/L (ref 44–121)
BUN/Creatinine Ratio: 8 — ABNORMAL LOW (ref 9–23)
BUN: 7 mg/dL (ref 6–24)
Bilirubin Total: 0.7 mg/dL (ref 0.0–1.2)
CO2: 20 mmol/L (ref 20–29)
Calcium: 8.9 mg/dL (ref 8.7–10.2)
Chloride: 101 mmol/L (ref 96–106)
Creatinine, Ser: 0.89 mg/dL (ref 0.57–1.00)
Globulin, Total: 3 g/dL (ref 1.5–4.5)
Glucose: 90 mg/dL (ref 70–99)
Potassium: 4 mmol/L (ref 3.5–5.2)
Sodium: 136 mmol/L (ref 134–144)
Total Protein: 7.2 g/dL (ref 6.0–8.5)
eGFR: 81 mL/min/{1.73_m2} (ref >59)

## 2022-09-29 LAB — HIV ANTIBODY (ROUTINE TESTING W REFLEX)

## 2022-09-29 LAB — HEMOGLOBIN A1C
Est. average glucose Bld gHb Est-mCnc: 117 mg/dL
Hgb A1c MFr Bld: 5.7 % — ABNORMAL HIGH (ref 4.8–5.6)

## 2022-09-29 LAB — CBC WITH DIFFERENTIAL/PLATELET
Basophils Absolute: 0.1 10*3/uL (ref 0.0–0.2)
Basos: 1 %
EOS (ABSOLUTE): 0.1 10*3/uL (ref 0.0–0.4)
Eos: 1 %
Hematocrit: 37.2 % (ref 34.0–46.6)
Hemoglobin: 11.5 g/dL (ref 11.1–15.9)
Immature Grans (Abs): 0 10*3/uL (ref 0.0–0.1)
Immature Granulocytes: 1 %
Lymphocytes Absolute: 1.4 10*3/uL (ref 0.7–3.1)
Lymphs: 23 %
MCH: 22.7 pg — ABNORMAL LOW (ref 26.6–33.0)
MCHC: 30.9 g/dL — ABNORMAL LOW (ref 31.5–35.7)
MCV: 74 fL — ABNORMAL LOW (ref 79–97)
Monocytes Absolute: 0.5 10*3/uL (ref 0.1–0.9)
Monocytes: 8 %
Neutrophils Absolute: 3.9 10*3/uL (ref 1.4–7.0)
Neutrophils: 66 %
Platelets: 338 10*3/uL (ref 150–450)
RBC: 5.06 x10E6/uL (ref 3.77–5.28)
RDW: 13.8 % (ref 11.7–15.4)
WBC: 5.9 10*3/uL (ref 3.4–10.8)

## 2022-09-29 LAB — LIPID PANEL: Triglycerides: 142 mg/dL (ref 0–149)

## 2022-09-29 LAB — VITAMIN B12

## 2022-09-29 LAB — VITAMIN D 25 HYDROXY (VIT D DEFICIENCY, FRACTURES)

## 2022-09-29 LAB — HEPATITIS C ANTIBODY

## 2022-09-29 LAB — BRAIN NATRIURETIC PEPTIDE

## 2022-09-29 LAB — TSH

## 2022-09-29 MED ORDER — NIFEDIPINE ER OSMOTIC RELEASE 60 MG PO TB24
ORAL_TABLET | ORAL | 3 refills | Status: DC
Start: 2022-09-29 — End: 2023-10-02

## 2022-09-29 NOTE — Assessment & Plan Note (Signed)
New onset of lower extremity swelling noted within the past several weeks following a trip with family.  She was seen at urgent care for this and subsequently sent for ultrasound which showed no evidence of DVT.  We discussed that this could be related to dietary changes and decreased movement associated with travel.  We will plan to rule out any signs of heart failure today with BNP.  She is not having any shortness of breath, chest pain, or wheezes present. Plan: Monitor BMP today. Continue to wear compression stockings on both feet and lower legs.  Monitor your sodium intake and avoid taking in excess salt. Increase your water intake and ensure you are getting at least 68 ounces of water daily. Elevate feet when sitting. Will make additional changes as necessary based on findings.

## 2022-09-29 NOTE — Assessment & Plan Note (Signed)
CPE completed today. Review of HM activities and recommendations discussed and provided on AVS. Anticipatory guidance, diet, and exercise recommendations provided. Medications, allergies, and hx reviewed and updated as necessary. Labs ordered. Will make changes as necessary based on results. Plan to f/u with CPE in 1 year or sooner for acute/chronic health needs as directed.

## 2022-09-29 NOTE — Progress Notes (Signed)
Tonya Clamp, DNP, AGNP-c [facility] [address] [place], [place] [address] Main Office [phone]  BP 124/74   Pulse 81   Ht 5' 5.5" (1.664 m)   Wt 230 lb 6.4 oz (104.5 kg)   LMP 09/03/2022   BMI 37.76 kg/m    Subjective:    Patient ID: Tonya Dominguez, female    DOB: 1975-07-12, 47 y.o.   MRN: [id number]  HPI: Tonya Dominguez is a 47 y.o. female presenting on 09/29/2022 for comprehensive medical examination.   Current medical concerns include: Tonya Dominguez mentions recent stressful situations she has encountered due to her father's health status she tells me her father lives in [place] to be hospitalized.  During that time she learned of chronic health conditions that she was not aware of which have been upsetting for her.  She has actively working with a counselor to manage her emotions.    She also mentions experiencing this swelling in her lower extremities she has been wearing compression stockings to help but is concerned of the etiology of the swelling.  She does mention the swelling was most notable during a recent trip to [place].  She reports at that time she was likely consuming more salt and drinking less water.  Since returning home she does feel that this welling has improved  Pertinent items are noted in HPI.  IMMUNIZATIONS:   Flu: Flu vaccine postponed until flu season Prevnar 13: Prevnar 13 N/A for this patient Prevnar 20: Prevnar 20 N/A for this patient Pneumovax 23: Pneumovax 23 N/A for this patient Vac Shingrix: Shingrix N/A for this patient HPV: HPV N/A for this patient Tetanus: Tetanus completed in the last 10 years COVID: COVID completed, documentation in chart   HEALTH MAINTENANCE: Pap Smear HM Status: is up to date Mammogram HM Status: is up to date Colon Cancer Screening HM Status: is up to date Bone Density HM Status: is not applicable for this patient STI Testing HM Status: was declined  Lung CT HM Status: is not applicable for  this patient  She denies any current concerns with her vision or dentition.  The patient eats a regular, healthy diet. She endorses moderate activity.    Most Recent Depression Screen:     09/29/2022    9:17 AM 06/03/2021    8:39 AM 05/27/2020    8:28 AM 05/22/2019   10:47 AM 04/04/2018    4:01 PM  Depression screen PHQ 2/9  Decreased Interest 0 0 0 0 0  Down, Depressed, Hopeless 0 0 0 0   PHQ - 2 Score 0 0 0 0 0   Most Recent Anxiety Screen:      No data to display         Most Recent Fall Screen:    09/29/2022    9:16 AM 06/03/2021    8:38 AM 05/27/2020    8:28 AM 05/22/2019   10:46 AM 04/04/2018    4:01 PM  Fall Risk   Falls in the past year? 1 0 0 0 0  Number falls in past yr: 0 0 0 0   Injury with Fall? 0 0 0 0   Risk for fall due to : No Fall Risks No Fall Risks No Fall Risks    Follow up Falls evaluation completed Falls evaluation completed Falls evaluation completed      Past medical history, surgical history, medications, allergies, family history and social history reviewed with patient today and changes made to appropriate areas of  the chart.  Past Medical History:  Past Medical History:  Diagnosis Date   Abnormal Pap smear of cervix    Anemia    Blood transfusion without reported diagnosis 2016   Elevated LDL cholesterol level 05/21/2019   GERD (gastroesophageal reflux disease)    hx of   Hypertension    meds   OSA (obstructive sleep apnea) 05/20/2018   uses CPAP   Pap smear abnormality of cervix/human papillomavirus (HPV) positive    Medications:  Current Outpatient Medications on File Prior to Visit  Medication Sig   Cholecalciferol (VITAMIN D3 PO) Take 1 tablet by mouth daily at 6 (six) AM.   cyanocobalamin 1000 MCG tablet Take 1,000 mcg by mouth daily. GUMMY   etonogestrel (NEXPLANON) 68 MG IMPL implant    Multiple Vitamin (MULTIVITAMIN PO) Take 1 tablet by mouth daily at 6 (six) AM.   ibuprofen (ADVIL) 800 MG tablet Take 800 mg by mouth 3 (three)  times daily. (Patient not taking: Reported on 08/19/2021)   No current facility-administered medications on file prior to visit.   Surgical History:  Past Surgical History:  Procedure Laterality Date   CESAREAN SECTION  2016   CESAREAN SECTION  2010   WISDOM TOOTH EXTRACTION  2008   Allergies:  No Known Allergies Family History:  Family History  Problem Relation Age of Onset   Hypertension Mother    Diabetes Mother    Colon polyps Father    Allergies Brother    Lung cancer Maternal Grandfather    Colon cancer Neg Hx    Esophageal cancer Neg Hx    Rectal cancer Neg Hx    Stomach cancer Neg Hx        Objective:    BP 124/74   Pulse 81   Ht 5' 5.5" (1.664 m)   Wt 230 lb 6.4 oz (104.5 kg)   LMP 09/03/2022   BMI 37.76 kg/m   Wt Readings from Last 3 Encounters:  09/29/22 230 lb 6.4 oz (104.5 kg)  08/19/21 205 lb (93 kg)  07/28/21 205 lb (93 kg)    Physical Exam Vitals and nursing note reviewed.  Constitutional:      General: She is not in acute distress.    Appearance: Normal appearance.  HENT:     Head: Normocephalic and atraumatic.     Right Ear: Hearing, tympanic membrane, ear canal and external ear normal.     Left Ear: Hearing, tympanic membrane, ear canal and external ear normal.     Nose: Nose normal.     Right Sinus: No maxillary sinus tenderness or frontal sinus tenderness.     Left Sinus: No maxillary sinus tenderness or frontal sinus tenderness.     Mouth/Throat:     Lips: Pink.     Mouth: Mucous membranes are moist.     Pharynx: Oropharynx is clear.  Eyes:     General: Lids are normal. Vision grossly intact.     Extraocular Movements: Extraocular movements intact.     Conjunctiva/sclera: Conjunctivae normal.     Pupils: Pupils are equal, round, and reactive to light.     Funduscopic exam:    Right eye: Red reflex present.        Left eye: Red reflex present.    Visual Fields: Right eye visual fields normal and left eye visual fields normal.   Neck:     Thyroid: No thyromegaly.     Vascular: No carotid bruit.  Cardiovascular:     Rate and  Rhythm: Normal rate and regular rhythm.     Chest Wall: PMI is not displaced.     Pulses: Normal pulses.          Dorsalis pedis pulses are 2+ on the right side and 2+ on the left side.       Posterior tibial pulses are 2+ on the right side and 2+ on the left side.     Heart sounds: Normal heart sounds. No murmur heard. Pulmonary:     Effort: Pulmonary effort is normal. No respiratory distress.     Breath sounds: Normal breath sounds.  Abdominal:     General: Abdomen is flat. Bowel sounds are normal. There is no distension.     Palpations: Abdomen is soft. There is no hepatomegaly, splenomegaly or mass.     Tenderness: There is no abdominal tenderness. There is no right CVA tenderness, left CVA tenderness, guarding or rebound.  Musculoskeletal:        General: Normal range of motion.     Cervical back: Full passive range of motion without pain, normal range of motion and neck supple. No tenderness.     Right lower leg: No edema.     Left lower leg: No edema.  Feet:     Left foot:     Toenail Condition: Left toenails are normal.  Lymphadenopathy:     Cervical: No cervical adenopathy.     Upper Body:     Right upper body: No supraclavicular adenopathy.     Left upper body: No supraclavicular adenopathy.  Skin:    General: Skin is warm and dry.     Capillary Refill: Capillary refill takes less than 2 seconds.     Nails: There is no clubbing.  Neurological:     General: No focal deficit present.     Mental Status: She is alert and oriented to person, place, and time.     GCS: GCS eye subscore is 4. GCS verbal subscore is 5. GCS motor subscore is 6.     Sensory: Sensation is intact.     Motor: Motor function is intact.     Coordination: Coordination is intact.     Gait: Gait is intact.     Deep Tendon Reflexes: Reflexes are normal and symmetric.  Psychiatric:        Attention and  Perception: Attention normal.        Mood and Affect: Mood normal.        Speech: Speech normal.        Behavior: Behavior normal. Behavior is cooperative.        Thought Content: Thought content normal.        Cognition and Memory: Cognition and memory normal.        Judgment: Judgment normal.     Results for orders placed or performed in visit on 09/29/22  Hemoglobin A1c  Result Value Ref Range   Hgb A1c MFr Bld WILL FOLLOW    Est. average glucose Bld gHb Est-mCnc WILL FOLLOW   CBC with Differential/Platelet  Result Value Ref Range   WBC 5.9 3.4 - 10.8 x10E3/uL   RBC 5.06 3.77 - 5.28 x10E6/uL   Hemoglobin 11.5 11.1 - 15.9 g/dL   Hematocrit 13.2 44.0 - 46.6 %   MCV 74 (L) 79 - 97 fL   MCH 22.7 (L) 26.6 - 33.0 pg   MCHC 30.9 (L) 31.5 - 35.7 g/dL   RDW 10.2 72.5 - 36.6 %   Platelets 338 150 -  450 x10E3/uL   Neutrophils 66 Not Estab. %   Lymphs 23 Not Estab. %   Monocytes 8 Not Estab. %   Eos 1 Not Estab. %   Basos 1 Not Estab. %   Neutrophils Absolute 3.9 1.4 - 7.0 x10E3/uL   Lymphocytes Absolute 1.4 0.7 - 3.1 x10E3/uL   Monocytes Absolute 0.5 0.1 - 0.9 x10E3/uL   EOS (ABSOLUTE) 0.1 0.0 - 0.4 x10E3/uL   Basophils Absolute 0.1 0.0 - 0.2 x10E3/uL   Immature Granulocytes 1 Not Estab. %   Immature Grans (Abs) 0.0 0.0 - 0.1 x10E3/uL  Comprehensive metabolic panel  Result Value Ref Range   Glucose WILL FOLLOW    BUN WILL FOLLOW    Creatinine, Ser WILL FOLLOW    eGFR WILL FOLLOW    BUN/Creatinine Ratio WILL FOLLOW    Sodium WILL FOLLOW    Potassium WILL FOLLOW    Chloride WILL FOLLOW    CO2 WILL FOLLOW    Calcium WILL FOLLOW    Total Protein WILL FOLLOW    Albumin WILL FOLLOW    Globulin, Total WILL FOLLOW    Bilirubin Total WILL FOLLOW    Alkaline Phosphatase WILL FOLLOW    AST WILL FOLLOW    ALT WILL FOLLOW   Hepatitis C antibody  Result Value Ref Range   Hep C Virus Ab WILL FOLLOW   HIV Antibody (routine testing w rflx)  Result Value Ref Range   HIV Screen 4th  Generation wRfx WILL FOLLOW   Lipid panel  Result Value Ref Range   Cholesterol, Total WILL FOLLOW    Triglycerides WILL FOLLOW    HDL WILL FOLLOW    VLDL Cholesterol Cal WILL FOLLOW    LDL Chol Calc (NIH) WILL FOLLOW    LDL CALC COMMENT: WILL FOLLOW    Chol/HDL Ratio WILL FOLLOW   TSH  Result Value Ref Range   TSH WILL FOLLOW   VITAMIN D 25 Hydroxy (Vit-D Deficiency, Fractures)  Result Value Ref Range   Vit D, 25-Hydroxy WILL FOLLOW   Brain natriuretic peptide  Result Value Ref Range   BNP WILL FOLLOW   Vitamin B12  Result Value Ref Range   Vitamin B-12 WILL FOLLOW          Assessment & Plan:   Problem List Items Addressed This Visit     Essential hypertension    Blood pressure is stable and at goal.  No alarm symptoms are present.  She is tolerating the medication well.  There is mild bilateral lower extremity edema reported although not present today due to compression stockings.  We discussed that nifedipine can cause swelling however I feel that this is less likely because given that she has been stable on this medicine for quite some time. Plan: - We will monitor labs today to assess hydration levels, kidney function, and BNP to ensure that these are not contributing to the swelling.   -If labs are stable and swelling continues despite low-sodium diet with increased water intake we can trial changing nifedipine to an alternative medication such as valsartan and monitoring for effectiveness.      Relevant Medications   NIFEdipine (PROCARDIA XL/NIFEDICAL XL) 60 MG 24 hr tablet   Other Relevant Orders   Hemoglobin A1c (Completed)   CBC with Differential/Platelet (Completed)   Comprehensive metabolic panel (Completed)   Hepatitis C antibody (Completed)   HIV Antibody (routine testing w rflx) (Completed)   Lipid panel (Completed)   TSH (Completed)   VITAMIN  D 25 Hydroxy (Vit-D Deficiency, Fractures) (Completed)   Brain natriuretic peptide (Completed)   Obesity (BMI  30-39.9)    Weight gain and difficulty losing weight briefly discussed today.  The patient does have a family history of diabetes and personal history of elevated lipids with normal blood sugars.  We will manage labs today and can discuss management options that may aid in weight loss in addition to lifestyle changes such as diet and exercise with most appropriate treatment options based on lab results.      Relevant Orders   Hemoglobin A1c (Completed)   CBC with Differential/Platelet (Completed)   Comprehensive metabolic panel (Completed)   Hepatitis C antibody (Completed)   HIV Antibody (routine testing w rflx) (Completed)   Lipid panel (Completed)   TSH (Completed)   VITAMIN D 25 Hydroxy (Vit-D Deficiency, Fractures) (Completed)   Brain natriuretic peptide (Completed)   Iron deficiency anemia    History of iron deficiency anemia.  Not currently on replacement therapy outside of a multivitamin.  Will monitor labs today.      Relevant Orders   Hemoglobin A1c (Completed)   CBC with Differential/Platelet (Completed)   Comprehensive metabolic panel (Completed)   Hepatitis C antibody (Completed)   HIV Antibody (routine testing w rflx) (Completed)   Lipid panel (Completed)   TSH (Completed)   VITAMIN D 25 Hydroxy (Vit-D Deficiency, Fractures) (Completed)   Brain natriuretic peptide (Completed)   Mixed hyperlipidemia    History of elevated lipids currently managed with diet.  Will monitor labs today to determine if any additional treatment is recommended.  No alarm symptoms are present at this time.      Relevant Medications   NIFEdipine (PROCARDIA XL/NIFEDICAL XL) 60 MG 24 hr tablet   Other Relevant Orders   Hemoglobin A1c (Completed)   CBC with Differential/Platelet (Completed)   Comprehensive metabolic panel (Completed)   Hepatitis C antibody (Completed)   HIV Antibody (routine testing w rflx) (Completed)   Lipid panel (Completed)   TSH (Completed)   VITAMIN D 25 Hydroxy  (Vit-D Deficiency, Fractures) (Completed)   Brain natriuretic peptide (Completed)   Bilateral leg edema    New onset of lower extremity swelling noted within the past several weeks following a trip with family.  She was seen at urgent care for this and subsequently sent for ultrasound which showed no evidence of DVT.  We discussed that this could be related to dietary changes and decreased movement associated with travel.  We will plan to rule out any signs of heart failure today with BNP.  She is not having any shortness of breath, chest pain, or wheezes present. Plan: Monitor BMP today. Continue to wear compression stockings on both feet and lower legs.  Monitor your sodium intake and avoid taking in excess salt. Increase your water intake and ensure you are getting at least 68 ounces of water daily. Elevate feet when sitting. Will make additional changes as necessary based on findings.      Relevant Orders   Hemoglobin A1c (Completed)   CBC with Differential/Platelet (Completed)   Comprehensive metabolic panel (Completed)   Hepatitis C antibody (Completed)   HIV Antibody (routine testing w rflx) (Completed)   Lipid panel (Completed)   TSH (Completed)   VITAMIN D 25 Hydroxy (Vit-D Deficiency, Fractures) (Completed)   Brain natriuretic peptide (Completed)   Encounter for annual physical exam - Primary    CPE completed today. Review of HM activities and recommendations discussed and provided on AVS. Anticipatory guidance, diet,  and exercise recommendations provided. Medications, allergies, and hx reviewed and updated as necessary. Labs ordered. Will make changes as necessary based on results. Plan to f/u with CPE in 1 year or sooner for acute/chronic health needs as directed.        Relevant Orders   Hemoglobin A1c (Completed)   CBC with Differential/Platelet (Completed)   Comprehensive metabolic panel (Completed)   Hepatitis C antibody (Completed)   HIV Antibody (routine testing w  rflx) (Completed)   Lipid panel (Completed)   TSH (Completed)   VITAMIN D 25 Hydroxy (Vit-D Deficiency, Fractures) (Completed)   Brain natriuretic peptide (Completed)   Other Visit Diagnoses     B12 deficiency       Relevant Orders   Vitamin B12 (Completed)   Vitamin D deficiency       Relevant Orders   VITAMIN D 25 Hydroxy (Vit-D Deficiency, Fractures) (Completed)          Follow up plan: Return in about 1 year (around 09/29/2023) for CPE.  NEXT PREVENTATIVE PHYSICAL DUE IN 1 YEAR.  PATIENT COUNSELING PROVIDED FOR ALL ADULT PATIENTS: A well balanced diet low in saturated fats, cholesterol, and moderation in carbohydrates.  This can be as simple as monitoring portion sizes and cutting back on sugary beverages such as soda and juice to start with.    Daily water consumption of at least 64 ounces.  Physical activity at least 180 minutes per week.  If just starting out, start 10 minutes a day and work your way up.   This can be as simple as taking the stairs instead of the elevator and walking 2-3 laps around the office  purposefully every day.   STD protection, partner selection, and regular testing if high risk.  Limited consumption of alcoholic beverages if alcohol is consumed. For men, I recommend no more than 14 alcoholic beverages per week, spread out throughout the week (max 2 per day). Avoid "binge" drinking or consuming large quantities of alcohol in one setting.  Please let me know if you feel you may need help with reduction or quitting alcohol consumption.   Avoidance of nicotine, if used. Please let me know if you feel you may need help with reduction or quitting nicotine use.   Daily mental health attention. This can be in the form of 5 minute daily meditation, prayer, journaling, yoga, reflection, etc.  Purposeful attention to your emotions and mental state can significantly improve your overall wellbeing  and  Health.  Please know that I am here to help you  with all of your health care goals and am happy to work with you to find a solution that works best for you.  The greatest advice I have received with any changes in life are to take it one step at a time, that even means if all you can focus on is the next 60 seconds, then do that and celebrate your victories.  With any changes in life, you will have set backs, and that is OK. The important thing to remember is, if you have a set back, it is not a failure, it is an opportunity to try again! Screening Testing Mammogram Every 1 -2 years based on history and risk factors Starting at age 56 Pap Smear Ages 21-39 every 3 years Ages 83-65 every 5 years with HPV testing More frequent testing may be required based on results and history Colon Cancer Screening Every 1-10 years based on test performed, risk factors, and history Starting at  age 12 Bone Density Screening Every 2-10 years based on history Starting at age 81 for women Recommendations for men differ based on medication usage, history, and risk factors AAA Screening One time ultrasound Men 34-50 years old who have every smoked Lung Cancer Screening Low Dose Lung CT every 12 months Age 29-80 years with a 30 pack-year smoking history who still smoke or who have quit within the last 15 years   Screening Labs Routine  Labs: Complete Blood Count (CBC), Complete Metabolic Panel (CMP), Cholesterol (Lipid Panel) Every 6-12 months based on history and medications May be recommended more frequently based on current conditions or previous results Hemoglobin A1c Lab Every 3-12 months based on history and previous results Starting at age 50 or earlier with diagnosis of diabetes, high cholesterol, BMI >26, and/or risk factors Frequent monitoring for patients with diabetes to ensure blood sugar control Thyroid Panel (TSH) Every 6 months based on history, symptoms, and risk factors May be repeated more often if on medication HIV One time  testing for all patients 70 and older May be repeated more frequently for patients with increased risk factors or exposure Hepatitis C One time testing for all patients 58 and older May be repeated more frequently for patients with increased risk factors or exposure Gonorrhea, Chlamydia Every 12 months for all sexually active persons 13-24 years Additional monitoring may be recommended for those who are considered high risk or who have symptoms Every 12 months for any woman on birth control, regardless of sexual activity PSA Men 17-70 years old with risk factors Additional screening may be recommended from age 622-69 based on risk factors, symptoms, and history  Vaccine Recommendations Tetanus Booster All adults every 10 years Flu Vaccine All patients 6 months and older every year COVID Vaccine All patients 12 years and older Initial dosing with booster May recommend additional booster based on age and health history HPV Vaccine 2 doses all patients age 62-26 Dosing may be considered for patients over 26 Shingles Vaccine (Shingrix) 2 doses all adults 55 years and older Pneumonia (Pneumovax 64) All adults 65 years and older May recommend earlier dosing based on health history One year apart from Prevnar 73 Pneumonia (Prevnar 15) All adults 65 years and older Dosed 1 year after Pneumovax 23 Pneumonia (Prevnar 20) One time alternative to the two dosing of 13 and 23 For all adults with initial dose of 23, 20 is recommended 1 year later For all adults with initial dose of 13, 23 is still recommended as second option 1 year later

## 2022-09-29 NOTE — Patient Instructions (Addendum)
WEIGHT LOSS PLANNING Your progress today shows:   For best management of weight, it is vital to balance intake versus output. This means the number of calories burned per day must be less than the calories you take in with food and drink.   I recommend trying to follow a diet with the following: Calories: 1200-1500 calories per day Carbohydrates: 150-180 grams of carbohydrates per day  Why: Gives your body enough "quick fuel" for cells to maintain normal function without sending them into starvation mode.  Protein: At least 90 grams of protein per day- 30 grams with each meal Why: Protein takes longer and uses more energy than carbohydrates to break down for fuel. The carbohydrates in your meals serves as quick energy sources and proteins help use some of that extra quick energy to break down to produce long term energy. This helps you not feel hungry as quickly and protein breakdown burns calories.  Water: Drink AT LEAST 64 ounces of water per day  Why: Water is essential to healthy metabolism. Water helps to fill the stomach and keep you fuller longer. Water is required for healthy digestion and filtering of waste in the body.  Fat: Limit fats in your diet- when choosing fats, choose foods with lower fats content such as lean meats (chicken, fish, [place]).  Why: Increased fat intake leads to storage "for later". Once you burn your carbohydrate energy, your body goes into fat and protein breakdown mode to help you loose weight.  Cholesterol: Fats and oils that are LIQUID at room temperature are best. Choose vegetable oils (olive oil, avocado oil, nuts). Avoid fats that are SOLID at room temperature (animal fats, processed meats). Healthy fats are often found in whole grains, beans, nuts, seeds, and berries.  Why: Elevated cholesterol levels lead to build up of cholesterol on the inside of your blood vessels. This will eventually cause the blood vessels to become hard and can lead to high blood  pressure and damage to your organs. When the blood flow is reduced, but the pressure is high from cholesterol buildup, parts of the cholesterol can break off and form clots that can go to the brain or heart leading to a stroke or heart attack.  Fiber: Increase amount of SOLUBLE the fiber in your diet. This helps to fill you up, lowers cholesterol, and helps with digestion. Some foods high in soluble fiber are oats, peas, beans, apples, carrots, barley, and citrus fruits.   Why: Fiber fills you up, helps remove excess cholesterol, and aids in healthy digestion which are all very important in weight management.   I recommend the following as a minimum activity routine: Purposeful walk or other physical activity at least 20 minutes every single day. This means purposefully taking a walk, jog, bike, swim, treadmill, elliptical, dance, etc.  This activity should be ABOVE your normal daily activities, such as walking at work. Goal exercise should be at least 150 minutes a week- work your way up to this.   Heart Rate: Your maximum exercise heart rate should be 220 - Your Age in Years. When exercising, get your heart rate up, but avoid going over the maximum targeted heart rate.  60-70% of your maximum heart rate is where you tend to burn the most fat. To find this number:  220 - Age In Years= Max HR  Max HR x 0.6 (or 0.7) = Fat Burning HR The Fat Burning HR is your goal heart rate while working out to burn the most  fat.  NEVER exercise to the point your feel lightheaded, weak, nauseated, dizzy. If you experience ANY of these symptoms- STOP exercise! Allow yourself to cool down and your heart rate to come down. Then restart slower next time.  If at ANY TIME you feel chest pain or chest pressure during exercise, STOP IMMEDIATELY and seek medical attention.     For all adult patients, I recommend A well balanced diet low in saturated fats, cholesterol, and moderation in carbohydrates.   This can be as  simple as monitoring portion sizes and cutting back on sugary beverages such as soda and juice to start with.    Daily water consumption of at least 64 ounces.  Physical activity at least 180 minutes per week, if just starting out.   This can be as simple as taking the stairs instead of the elevator and walking 2-3 laps around the office  purposefully every day.   STD protection, partner selection, and regular testing if high risk.  Limited consumption of alcoholic beverages if alcohol is consumed.  For women, I recommend no more than 7 alcoholic beverages per week, spread out throughout the week.  Avoid "binge" drinking or consuming large quantities of alcohol in one setting.   Please let me know if you feel you may need help with reduction or quitting alcohol consumption.   Avoidance of nicotine, if used.  Please let me know if you feel you may need help with reduction or quitting nicotine use.   Daily mental health attention.  This can be in the form of 5 minute daily meditation, prayer, journaling, yoga, reflection, etc.   Purposeful attention to your emotions and mental state can significantly improve your overall wellbeing  and  Health.  Please know that I am here to help you with all of your health care goals and am happy to work with you to find a solution that works best for you.  The greatest advice I have received with any changes in life are to take it one step at a time, that even means if all you can focus on is the next 60 seconds, then do that and celebrate your victories.  With any changes in life, you will have set backs, and that is OK. The important thing to remember is, if you have a set back, it is not a failure, it is an opportunity to try again!  Health Maintenance Recommendations Screening Testing Mammogram Every 1 -2 years based on history and risk factors Starting at age 65 Pap Smear Ages 21-39 every 3 years Ages 66-65 every 5 years with HPV testing More  frequent testing may be required based on results and history Colon Cancer Screening Every 1-10 years based on test performed, risk factors, and history Starting at age 67 Bone Density Screening Every 2-10 years based on history Starting at age 3 for women Recommendations for men differ based on medication usage, history, and risk factors AAA Screening One time ultrasound Men 13-40 years old who have every smoked Lung Cancer Screening Low Dose Lung CT every 12 months Age 47-80 years with a 30 pack-year smoking history who still smoke or who have quit within the last 15 years  Screening Labs Routine  Labs: Complete Blood Count (CBC), Complete Metabolic Panel (CMP), Cholesterol (Lipid Panel) Every 6-12 months based on history and medications May be recommended more frequently based on current conditions or previous results Hemoglobin A1c Lab Every 3-12 months based on history and previous results Starting at  age 40 or earlier with diagnosis of diabetes, high cholesterol, BMI >26, and/or risk factors Frequent monitoring for patients with diabetes to ensure blood sugar control Thyroid Panel (TSH w/ T3 & T4) Every 6 months based on history, symptoms, and risk factors May be repeated more often if on medication HIV One time testing for all patients 13 and older May be repeated more frequently for patients with increased risk factors or exposure Hepatitis C One time testing for all patients 31 and older May be repeated more frequently for patients with increased risk factors or exposure Gonorrhea, Chlamydia Every 12 months for all sexually active persons 13-24 years Additional monitoring may be recommended for those who are considered high risk or who have symptoms PSA Men 17-22 years old with risk factors Additional screening may be recommended from age 70-69 based on risk factors, symptoms, and history  Vaccine Recommendations Tetanus Booster All adults every 10 years Flu  Vaccine All patients 6 months and older every year COVID Vaccine All patients 12 years and older Initial dosing with booster May recommend additional booster based on age and health history HPV Vaccine 2 doses all patients age 60-26 Dosing may be considered for patients over 26 Shingles Vaccine (Shingrix) 2 doses all adults 55 years and older Pneumonia (Pneumovax 23) All adults 65 years and older May recommend earlier dosing based on health history Pneumonia (Prevnar 60) All adults 65 years and older Dosed 1 year after Pneumovax 23  Additional Screening, Testing, and Vaccinations may be recommended on an individualized basis based on family history, health history, risk factors, and/or exposure.

## 2022-09-29 NOTE — Assessment & Plan Note (Signed)
Weight gain and difficulty losing weight briefly discussed today.  The patient does have a family history of diabetes and personal history of elevated lipids with normal blood sugars.  We will manage labs today and can discuss management options that may aid in weight loss in addition to lifestyle changes such as diet and exercise with most appropriate treatment options based on lab results.

## 2022-09-29 NOTE — Assessment & Plan Note (Signed)
Blood pressure is stable and at goal.  No alarm symptoms are present.  She is tolerating the medication well.  There is mild bilateral lower extremity edema reported although not present today due to compression stockings.  We discussed that nifedipine can cause swelling however I feel that this is less likely because given that she has been stable on this medicine for quite some time. Plan: - We will monitor labs today to assess hydration levels, kidney function, and BNP to ensure that these are not contributing to the swelling.   -If labs are stable and swelling continues despite low-sodium diet with increased water intake we can trial changing nifedipine to an alternative medication such as valsartan and monitoring for effectiveness.

## 2022-09-29 NOTE — Assessment & Plan Note (Signed)
History of elevated lipids currently managed with diet.  Will monitor labs today to determine if any additional treatment is recommended.  No alarm symptoms are present at this time.

## 2022-09-29 NOTE — Assessment & Plan Note (Signed)
History of iron deficiency anemia.  Not currently on replacement therapy outside of a multivitamin.  Will monitor labs today.

## 2022-09-30 ENCOUNTER — Other Ambulatory Visit: Payer: Self-pay | Admitting: Nurse Practitioner

## 2022-09-30 DIAGNOSIS — E559 Vitamin D deficiency, unspecified: Secondary | ICD-10-CM

## 2022-09-30 MED ORDER — VITAMIN D3 1.25 MG (50000 UT) PO CAPS
1.0000 | ORAL_CAPSULE | ORAL | 1 refills | Status: DC
Start: 2022-09-30 — End: 2023-10-02

## 2023-01-11 DIAGNOSIS — I872 Venous insufficiency (chronic) (peripheral): Secondary | ICD-10-CM | POA: Insufficient documentation

## 2023-01-13 LAB — HM PAP SMEAR: HPV, high-risk: NEGATIVE

## 2023-02-03 ENCOUNTER — Telehealth: Payer: Self-pay | Admitting: Family Medicine

## 2023-02-03 DIAGNOSIS — J069 Acute upper respiratory infection, unspecified: Secondary | ICD-10-CM

## 2023-02-03 MED ORDER — PROMETHAZINE-DM 6.25-15 MG/5ML PO SYRP: 5.0000 mL | ORAL_SOLUTION | Freq: Four times a day (QID) | ORAL | 0 refills | Status: DC | PRN

## 2023-02-03 NOTE — Patient Instructions (Signed)
  Evolett R Weidinger, thank you for joining Freddy Finner, NP for today's virtual visit.  While this provider is not your primary care provider (PCP), if your PCP is located in our provider database this encounter information will be shared with them immediately following your visit.   A [facility] MyChart account gives you access to today's visit and all your visits, tests, and labs performed at [facility] " click here if you don't have a [facility] MyChart account or go to [email or web address]  Consent: (Patient) Tonya Dominguez provided verbal consent for this virtual visit at the beginning of the encounter.  Current Medications:  Current Outpatient Medications:    promethazine-dextromethorphan (PROMETHAZINE-DM) 6.25-15 MG/5ML syrup, Take 5 mLs by mouth 4 (four) times daily as needed for cough., Disp: 118 mL, Rfl: 0   Cholecalciferol (VITAMIN D3) 1.25 MG (50000 UT) CAPS, Take 1 capsule (1.25 mg total) by mouth once a week., Disp: 12 capsule, Rfl: 1   cyanocobalamin 1000 MCG tablet, Take 1,000 mcg by mouth daily. GUMMY, Disp: , Rfl:    etonogestrel (NEXPLANON) 68 MG IMPL implant, , Disp: , Rfl:    ibuprofen (ADVIL) 800 MG tablet, Take 800 mg by mouth 3 (three) times daily. (Patient not taking: Reported on 08/19/2021), Disp: , Rfl:    Multiple Vitamin (MULTIVITAMIN PO), Take 1 tablet by mouth daily at 6 (six) AM., Disp: , Rfl:    NIFEdipine (PROCARDIA XL/NIFEDICAL XL) 60 MG 24 hr tablet, TAKE 1 TABLET BY MOUTH EVERY DAY, Disp: 90 tablet, Rfl: 3   Medications ordered in this encounter:  Meds ordered this encounter  Medications   promethazine-dextromethorphan (PROMETHAZINE-DM) 6.25-15 MG/5ML syrup    Sig: Take 5 mLs by mouth 4 (four) times daily as needed for cough.    Dispense:  118 mL    Refill:  0    Supervising Provider:   Merrilee Jansky [[id number]]     *If you need refills on other medications prior to your next appointment, please contact your  pharmacy*  Follow-Up: Call back or seek an in-person evaluation if the symptoms worsen or if the condition fails to improve as anticipated.  [facility] [phone]  Other Instructions  URI recommendations: - Increased rest - Increasing Fluids - Acetaminophen / ibuprofen as needed for fever/pain.  - Salt water gargling, chloraseptic spray and throat lozenges - Mucinex if mucus is present and increasing.  - Saline nasal spray if congestion or if nasal passages feel dry. - Humidifying the air.    If you have been instructed to have an in-person evaluation today at a local Urgent Care facility, please use the link below. It will take you to a list of all of our available [facility], including address, phone number and hours of operation. Please do not delay care.  [facility]  If you or a family member do not have a primary care provider, use the link below to schedule a visit and establish care. When you choose a [facility] primary care physician or advanced practice provider, you gain a long-term partner in health. Find a Primary Care Provider  Learn more about [facility] in-office and virtual care options: [facility] - Get Care Now

## 2023-02-03 NOTE — Progress Notes (Signed)
Virtual Visit Consent   Tonya Dominguez, you are scheduled for a virtual visit with a  provider today. Just as with appointments in the office, your consent must be obtained to participate. Your consent will be active for this visit and any virtual visit you may have with one of our providers in the next 365 days. If you have a MyChart account, a copy of this consent can be sent to you electronically.  As this is a virtual visit, video technology does not allow for your provider to perform a traditional examination. This may limit your provider's ability to fully assess your condition. If your provider identifies any concerns that need to be evaluated in person or the need to arrange testing (such as labs, EKG, etc.), we will make arrangements to do so. Although advances in technology are sophisticated, we cannot ensure that it will always work on either your end or our end. If the connection with a video visit is poor, the visit may have to be switched to a telephone visit. With either a video or telephone visit, we are not always able to ensure that we have a secure connection.  By engaging in this virtual visit, you consent to the provision of healthcare and authorize for your insurance to be billed (if applicable) for the services provided during this visit. Depending on your insurance coverage, you may receive a charge related to this service.  I need to obtain your verbal consent now. Are you willing to proceed with your visit today? Tonya Dominguez has provided verbal consent on 02/03/2023 for a virtual visit (video or telephone). Freddy Finner, NP  Date: 02/03/2023 12:15 PM  Virtual Visit via Video Note   I, Freddy Finner, connected with  Tonya Dominguez  ([id number], January 21, 1976) on 02/03/23 at 12:15 PM EST by a video-enabled telemedicine application and verified that I am speaking with the correct person using two identifiers.  Location: Patient: Virtual Visit Location Patient:  Home Provider: Virtual Visit Location Provider: Home Office   I discussed the limitations of evaluation and management by telemedicine and the availability of in person appointments. The patient expressed understanding and agreed to proceed.    History of Present Illness: Tonya Dominguez is a 47 y.o. who identifies as a female who was assigned female at birth, and is being seen today for coughing   Onset was Tuesday with sore throat, congestion overnight and started clear turned yellow Associated symptoms are pressure in facial sinuses, and coughing- from drainage, inner ear itching, symptoms are worse at night. Modifying factors are mucinex  Denies chest pain, shortness of breath, fevers, chills  Exposure to sick contacts- known with youngest having sore throat and then rest of family started having symptoms COVID test: no  Problems:  Patient Active Problem List   Diagnosis Date Noted   Bilateral leg edema 09/29/2022   Encounter for annual physical exam 09/29/2022   Iron deficiency anemia 06/03/2021   Leiomyoma of uterus 06/03/2021   Phlebitis and thrombophlebitis of superficial vessels of lower extremities 06/03/2021   Mixed hyperlipidemia 06/03/2021   OSA (obstructive sleep apnea) 05/20/2018   Essential hypertension 04/04/2018   Obesity (BMI 30-39.9) 04/04/2018   History of colposcopy 04/04/2018   Family history of diabetes mellitus in first degree relative 04/04/2018   Snoring 04/04/2018   Excessive daytime sleepiness 04/04/2018   Pap smear abnormality of cervix/human papillomavirus (HPV) positive     Allergies: No Known Allergies Medications:  Current Outpatient  Medications:    Cholecalciferol (VITAMIN D3) 1.25 MG (50000 UT) CAPS, Take 1 capsule (1.25 mg total) by mouth once a week., Disp: 12 capsule, Rfl: 1   cyanocobalamin 1000 MCG tablet, Take 1,000 mcg by mouth daily. GUMMY, Disp: , Rfl:    etonogestrel (NEXPLANON) 68 MG IMPL implant, , Disp: , Rfl:    ibuprofen (ADVIL)  800 MG tablet, Take 800 mg by mouth 3 (three) times daily. (Patient not taking: Reported on 08/19/2021), Disp: , Rfl:    Multiple Vitamin (MULTIVITAMIN PO), Take 1 tablet by mouth daily at 6 (six) AM., Disp: , Rfl:    NIFEdipine (PROCARDIA XL/NIFEDICAL XL) 60 MG 24 hr tablet, TAKE 1 TABLET BY MOUTH EVERY DAY, Disp: 90 tablet, Rfl: 3  Observations/Objective: Patient is well-developed, well-nourished in no acute distress.  Resting comfortably  at home.  Head is normocephalic, atraumatic.  No labored breathing.  Speech is clear and coherent with logical content.  Patient is alert and oriented at baseline.  Cough present  Congestion tone noted  Assessment and Plan:  1. Viral URI with cough (Primary)  - promethazine-dextromethorphan (PROMETHAZINE-DM) 6.25-15 MG/5ML syrup; Take 5 mLs by mouth 4 (four) times daily as needed for cough.  Dispense: 118 mL; Refill: 0   URI recommendations: - Increased rest - Increasing Fluids - Acetaminophen / ibuprofen as needed for fever/pain.  - Salt water gargling, chloraseptic spray and throat lozenges - Mucinex if mucus is present and increasing- try to avoid at night.   - Saline nasal spray if congestion or if nasal passages feel dry. - Humidifying the air.   Reviewed side effects, risks and benefits of medication.    Patient acknowledged agreement and understanding of the plan.   Past Medical, Surgical, Social History, Allergies, and Medications have been Reviewed.    Follow Up Instructions: I discussed the assessment and treatment plan with the patient. The patient was provided an opportunity to ask questions and all were answered. The patient agreed with the plan and demonstrated an understanding of the instructions.  A copy of instructions were sent to the patient via MyChart unless otherwise noted below.    The patient was advised to call back or seek an in-person evaluation if the symptoms worsen or if the condition fails to improve as  anticipated.    Freddy Finner, NP

## 2023-02-07 IMAGING — MG MM DIGITAL SCREENING BILAT W/ TOMO AND CAD
8 series · 8 of 24 positions shown · non-contrast
Comparison: Previous exam(s).

ACR Breast Density Category a: The breast tissue is almost entirely
fatty.

CLINICAL DATA: Screening.

EXAM:
DIGITAL SCREENING BILATERAL MAMMOGRAM WITH TOMOSYNTHESIS AND CAD
TECHNIQUE: Bilateral screening digital craniocaudal and mediolateral oblique
mammograms were obtained. Bilateral screening digital breast
tomosynthesis was performed. The images were evaluated with
computer-aided detection.

[L MLO synth-2D]
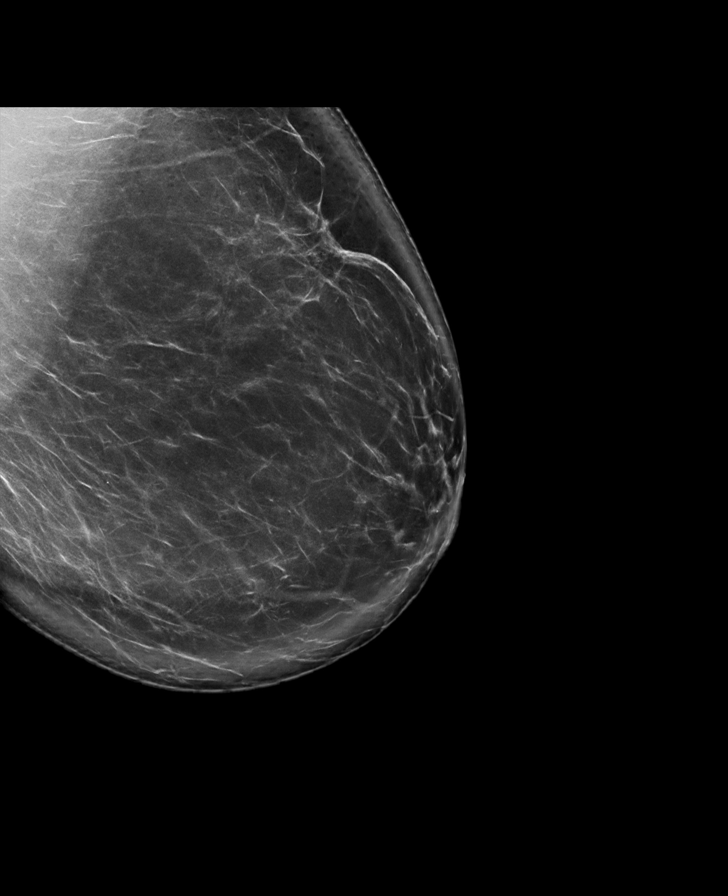

[L CC synth-2D]
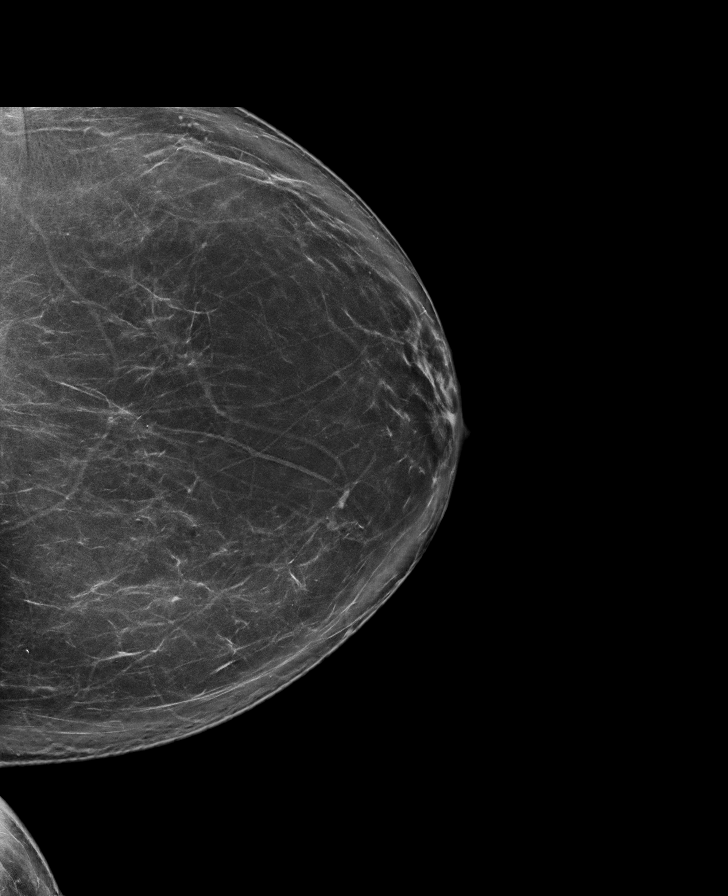

[R CC synth-2D]
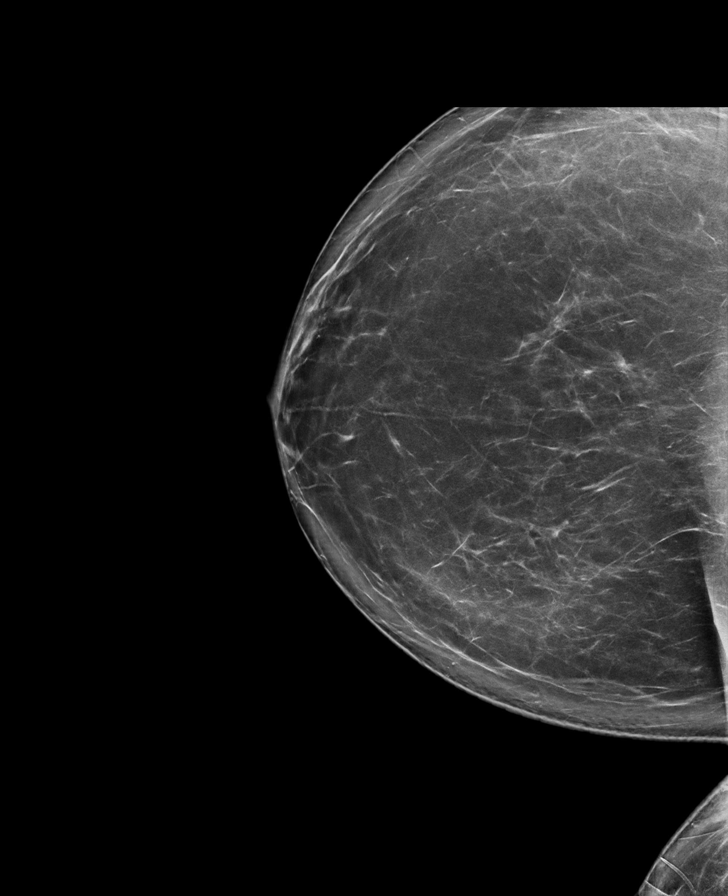

[R MLO synth-2D]
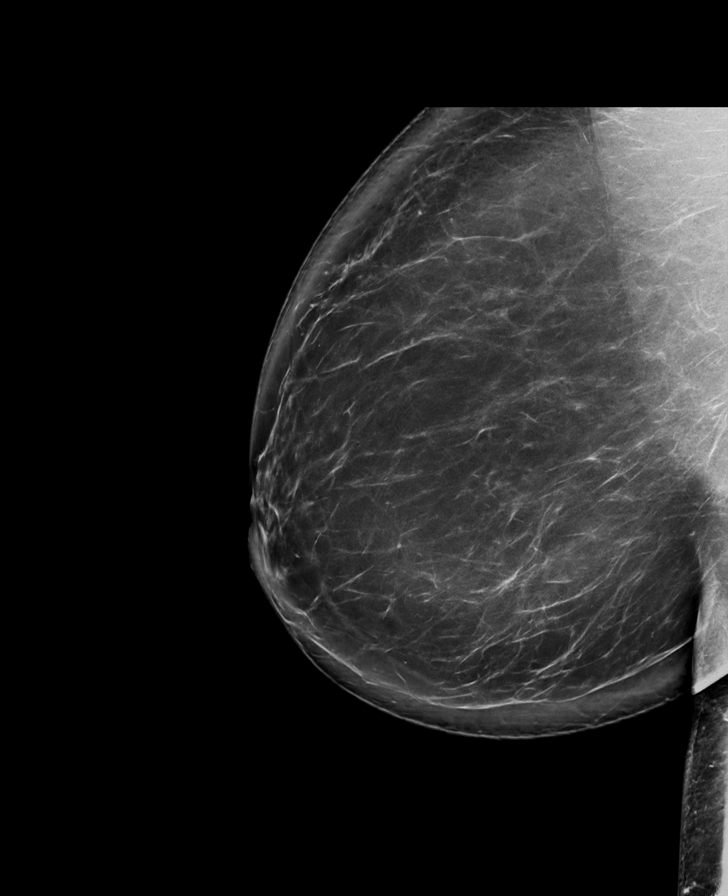

[L CC tomo · tomo slice 49/96.0]
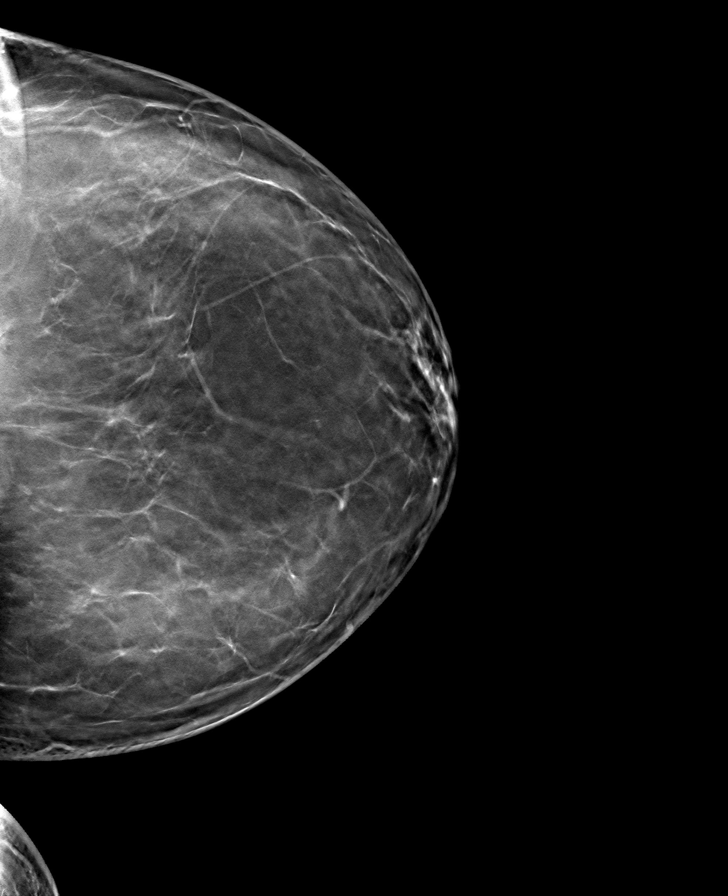

[L MLO tomo · tomo slice 55/108.0]
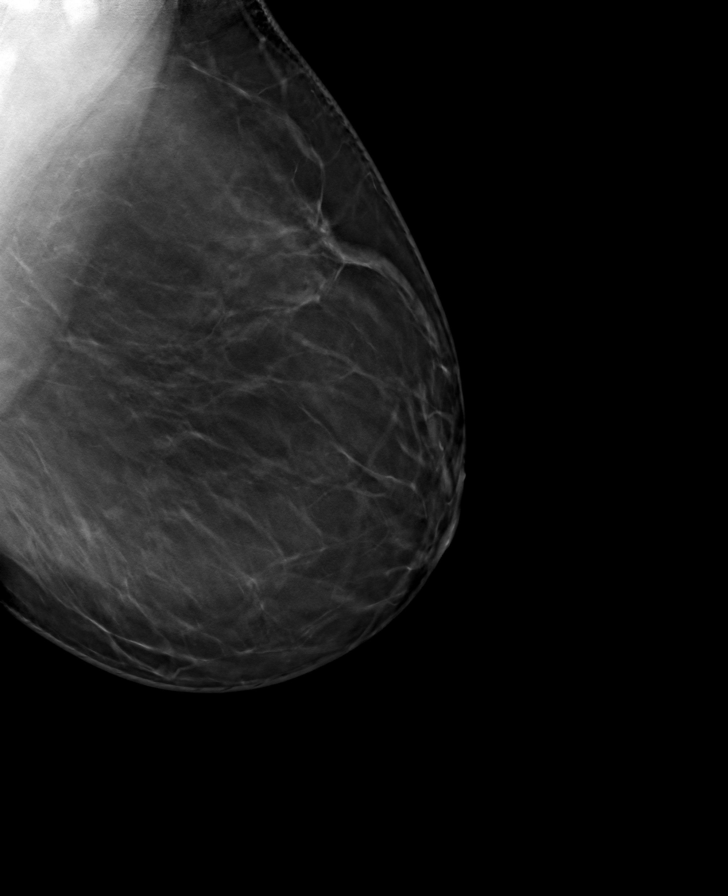

[R MLO tomo · tomo slice 57/112.0]
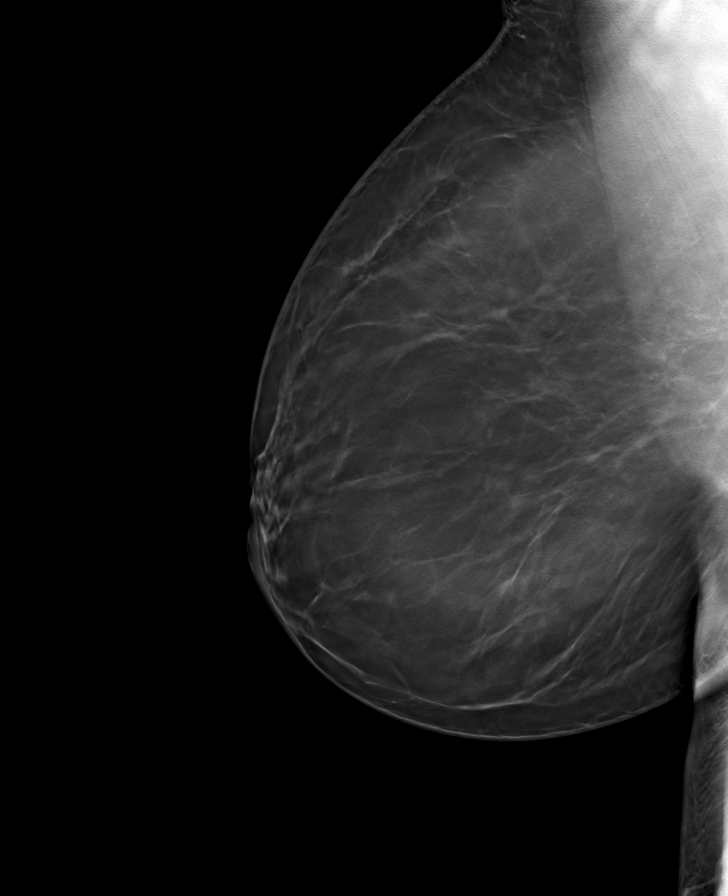

[R CC tomo · tomo slice 51/101.0]
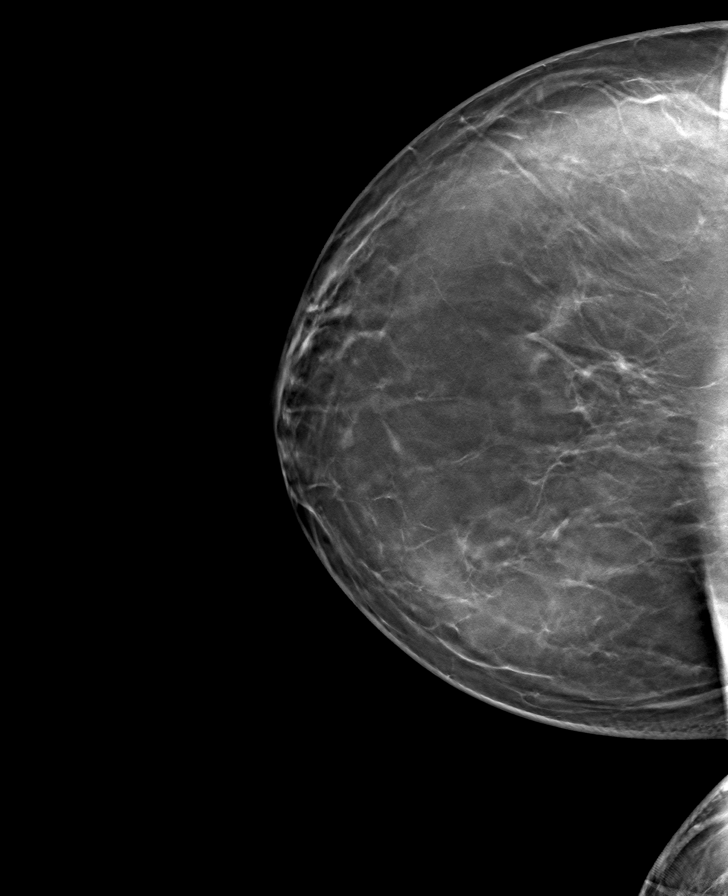

[8 of 24 positions shown; findings below may reference images not displayed]

FINDINGS: There are no findings suspicious for malignancy.
IMPRESSION: No mammographic evidence of malignancy. A result letter of this
screening mammogram will be mailed directly to the patient.

RECOMMENDATION:
Screening mammogram in one year. (Code:0E-3-N98)

BI-RADS CATEGORY  1: Negative.

## 2023-10-02 ENCOUNTER — Ambulatory Visit: Admitting: Nurse Practitioner

## 2023-10-02 ENCOUNTER — Encounter: Payer: Self-pay | Admitting: Nurse Practitioner

## 2023-10-02 VITALS — BP 124/80 | HR 80 | Ht 65.0 in | Wt 231.4 lb

## 2023-10-02 DIAGNOSIS — I872 Venous insufficiency (chronic) (peripheral): Secondary | ICD-10-CM

## 2023-10-02 DIAGNOSIS — G4733 Obstructive sleep apnea (adult) (pediatric): Secondary | ICD-10-CM

## 2023-10-02 DIAGNOSIS — I10 Essential (primary) hypertension: Secondary | ICD-10-CM

## 2023-10-02 DIAGNOSIS — Z0000 Encounter for general adult medical examination without abnormal findings: Secondary | ICD-10-CM

## 2023-10-02 DIAGNOSIS — E559 Vitamin D deficiency, unspecified: Secondary | ICD-10-CM

## 2023-10-02 DIAGNOSIS — E538 Deficiency of other specified B group vitamins: Secondary | ICD-10-CM

## 2023-10-02 DIAGNOSIS — E669 Obesity, unspecified: Secondary | ICD-10-CM | POA: Diagnosis not present

## 2023-10-02 DIAGNOSIS — N920 Excessive and frequent menstruation with regular cycle: Secondary | ICD-10-CM

## 2023-10-02 DIAGNOSIS — E782 Mixed hyperlipidemia: Secondary | ICD-10-CM

## 2023-10-02 MED ORDER — NIFEDIPINE ER OSMOTIC RELEASE 60 MG PO TB24: ORAL_TABLET | ORAL | 3 refills | Status: AC

## 2023-10-02 NOTE — Progress Notes (Signed)
 Last PAP: obgyn Dr. Marlis Leash obgyn last year had PAP.    Tonya Doing, DNP, AGNP-c [facility] [address] [place], [place] [address] Main Office [phone]  BP 124/80   Pulse 80   Ht 5' 5 (1.651 m)   Wt 231 lb 6.4 oz (105 kg)   BMI 38.51 kg/m    Subjective:    Patient ID: Tonya Dominguez, female    DOB: 1976-02-02, 48 y.o.   MRN: [id number]  HPI: History of Present Illness Tonya Dominguez is a 48 year old female here for CPE.   She experiences ongoing heavy menstrual bleeding with regular periods that are extremely heavy for two to three days before normalizing. She uses both tampons and pads due to the heaviness. Previously, she was on nexplanon, which caused prolonged periods, but this was discontinued. She has been prescribed a medication to help with the flow, which she takes for up to five days at a time (she is unable to remember the name), though she did not take it during her recent trip to [place]  and once again did experience the symptoms of heavy bleeding. This is managed closely with GYN. SABRA  She has a history of venous insufficiency and uses compression socks, especially when traveling, to manage swelling. She is mindful of her water intake and tries to avoid prolonged standing. She has no concerns with swelling at this time.   She has a history of sleep apnea and uses a CPAP machine nightly. She reports improved sleep quality with its use, although she sometimes forgets to turn it on after bathroom breaks. She does not use the humidifier feature due to travel constraints and has not had any adverse symptoms from this.   She is not currently on any appetite suppressants but has previously used drops from a weight loss regimen in [place] , which helped with sugar cravings. She maintains a water intake of 80-90 ounces daily. She is considering restarting the drops and weight loss plan. She is unable to remember exactly what was in the drops. She  also previously tried phentermine when she was younger, but this was discontinued by her PCP for elevated HR and BP.   No issues with vision, hearing, or swallowing. Occasionally experiences longer bowel emptying times, which she associates with dietary changes. No shortness of breath, chest pain, or palpitations. Her family history includes her mother having a partial hysterectomy around the age of 2 due to heavy menstrual bleeding.    Pertinent items are noted in HPI.   Most Recent Depression Screen:     10/02/2023    8:30 AM 09/29/2022    9:17 AM 06/03/2021    8:39 AM 05/27/2020    8:28 AM 05/22/2019   10:47 AM  Depression screen PHQ 2/9  Decreased Interest 0 0 0 0 0  Down, Depressed, Hopeless 0 0 0 0 0  PHQ - 2 Score 0 0 0 0 0   Most Recent Anxiety Screen:      No data to display         Most Recent Fall Screen:    10/02/2023    8:29 AM 09/29/2022    9:16 AM 06/03/2021    8:38 AM 05/27/2020    8:28 AM 05/22/2019   10:46 AM  Fall Risk   Falls in the past year? 0 1 0 0 0   Number falls in past yr: 0 0 0 0 0  Injury with Fall? 0 0 0 0  0  Risk for fall due to : No Fall Risks No Fall Risks No Fall Risks No Fall Risks   Follow up Falls evaluation completed Falls evaluation completed Falls evaluation completed  Falls evaluation completed       Data saved with a previous flowsheet row definition    Past medical history, surgical history, medications, allergies, family history and social history reviewed with patient today and changes made to appropriate areas of the chart.  Past Medical History:  Past Medical History:  Diagnosis Date   Abnormal Pap smear of cervix    Anemia    Blood transfusion without reported diagnosis 2016   Elevated LDL cholesterol level 05/21/2019   GERD (gastroesophageal reflux disease)    hx of   History of colposcopy 04/04/2018   Hypertension    meds   OSA (obstructive sleep apnea) 04/04/2018   uses CPAP   Pap smear abnormality of cervix/human  papillomavirus (HPV) positive    Medications:  Current Outpatient Medications on File Prior to Visit  Medication Sig   Multiple Vitamin (MULTIVITAMIN PO) Take 1 tablet by mouth daily at 6 (six) AM.   Multiple Vitamins-Iron (ONE-TABLET-DAILY/IRON PO)    VITAMIN D  PO Take by mouth. (Patient taking differently: Take by mouth. 10,000 units and Vit k2)   No current facility-administered medications on file prior to visit.   Surgical History:  Past Surgical History:  Procedure Laterality Date   CESAREAN SECTION  2016   CESAREAN SECTION  2010   WISDOM TOOTH EXTRACTION  2008   Allergies:  No Known Allergies Family History:  Family History  Problem Relation Age of Onset   Hypertension Mother    Diabetes Mother    Colon polyps Father    Allergies Brother    Lung cancer Maternal Grandfather    Colon cancer Neg Hx    Esophageal cancer Neg Hx    Rectal cancer Neg Hx    Stomach cancer Neg Hx        Objective:    BP 124/80   Pulse 80   Ht 5' 5 (1.651 m)   Wt 231 lb 6.4 oz (105 kg)   BMI 38.51 kg/m   Wt Readings from Last 3 Encounters:  10/02/23 231 lb 6.4 oz (105 kg)  09/29/22 230 lb 6.4 oz (104.5 kg)  08/19/21 205 lb (93 kg)    Physical Exam Vitals and nursing note reviewed.  Constitutional:      General: She is not in acute distress.    Appearance: Normal appearance. She is obese.  HENT:     Head: Normocephalic and atraumatic.     Right Ear: Hearing, tympanic membrane, ear canal and external ear normal.     Left Ear: Hearing, tympanic membrane, ear canal and external ear normal.     Nose: Nose normal.     Right Sinus: No maxillary sinus tenderness or frontal sinus tenderness.     Left Sinus: No maxillary sinus tenderness or frontal sinus tenderness.     Mouth/Throat:     Lips: Pink.     Mouth: Mucous membranes are moist.     Pharynx: Oropharynx is clear.  Eyes:     General: Lids are normal. Vision grossly intact.     Extraocular Movements: Extraocular movements  intact.     Conjunctiva/sclera: Conjunctivae normal.     Pupils: Pupils are equal, round, and reactive to light.     Funduscopic exam:    Right eye: Red reflex present.  Left eye: Red reflex present.    Visual Fields: Right eye visual fields normal and left eye visual fields normal.  Neck:     Thyroid: No thyromegaly.     Vascular: No carotid bruit.  Cardiovascular:     Rate and Rhythm: Normal rate and regular rhythm.     Chest Wall: PMI is not displaced.     Pulses: Normal pulses.          Dorsalis pedis pulses are 2+ on the right side and 2+ on the left side.       Posterior tibial pulses are 2+ on the right side and 2+ on the left side.     Heart sounds: Normal heart sounds. No murmur heard. Pulmonary:     Effort: Pulmonary effort is normal. No respiratory distress.     Breath sounds: Normal breath sounds.  Abdominal:     General: Abdomen is flat. Bowel sounds are normal. There is no distension.     Palpations: Abdomen is soft. There is no hepatomegaly, splenomegaly or mass.     Tenderness: There is no abdominal tenderness. There is no right CVA tenderness, left CVA tenderness, guarding or rebound.  Musculoskeletal:        General: Normal range of motion.     Cervical back: Full passive range of motion without pain, normal range of motion and neck supple. No tenderness.     Right lower leg: No edema.     Left lower leg: No edema.  Feet:     Left foot:     Toenail Condition: Left toenails are normal.  Lymphadenopathy:     Cervical: No cervical adenopathy.     Upper Body:     Right upper body: No supraclavicular adenopathy.     Left upper body: No supraclavicular adenopathy.  Skin:    General: Skin is warm and dry.     Capillary Refill: Capillary refill takes less than 2 seconds.     Nails: There is no clubbing.  Neurological:     General: No focal deficit present.     Mental Status: She is alert and oriented to person, place, and time.     GCS: GCS eye subscore is  4. GCS verbal subscore is 5. GCS motor subscore is 6.     Sensory: Sensation is intact. No sensory deficit.     Motor: Motor function is intact. No weakness.     Coordination: Coordination is intact.     Gait: Gait is intact. Gait normal.     Deep Tendon Reflexes: Reflexes are normal and symmetric.  Psychiatric:        Attention and Perception: Attention normal.        Mood and Affect: Mood normal.        Speech: Speech normal.        Behavior: Behavior normal. Behavior is cooperative.        Thought Content: Thought content normal.        Cognition and Memory: Cognition and memory normal.        Judgment: Judgment normal.      Results for orders placed or performed in visit on 09/29/22  Hemoglobin A1c   Collection Time: 09/29/22 10:36 AM  Result Value Ref Range   Hgb A1c MFr Bld 5.7 (H) 4.8 - 5.6 %   Est. average glucose Bld gHb Est-mCnc 117 mg/dL  CBC with Differential/Platelet   Collection Time: 09/29/22 10:36 AM  Result Value Ref Range   WBC  5.9 3.4 - 10.8 x10E3/uL   RBC 5.06 3.77 - 5.28 x10E6/uL   Hemoglobin 11.5 11.1 - 15.9 g/dL   Hematocrit 62.7 65.9 - 46.6 %   MCV 74 (L) 79 - 97 fL   MCH 22.7 (L) 26.6 - 33.0 pg   MCHC 30.9 (L) 31.5 - 35.7 g/dL   RDW 86.1 88.2 - 84.5 %   Platelets 338 150 - 450 x10E3/uL   Neutrophils 66 Not Estab. %   Lymphs 23 Not Estab. %   Monocytes 8 Not Estab. %   Eos 1 Not Estab. %   Basos 1 Not Estab. %   Neutrophils Absolute 3.9 1.4 - 7.0 x10E3/uL   Lymphocytes Absolute 1.4 0.7 - 3.1 x10E3/uL   Monocytes Absolute 0.5 0.1 - 0.9 x10E3/uL   EOS (ABSOLUTE) 0.1 0.0 - 0.4 x10E3/uL   Basophils Absolute 0.1 0.0 - 0.2 x10E3/uL   Immature Granulocytes 1 Not Estab. %   Immature Grans (Abs) 0.0 0.0 - 0.1 x10E3/uL  Comprehensive metabolic panel   Collection Time: 09/29/22 10:36 AM  Result Value Ref Range   Glucose 90 70 - 99 mg/dL   BUN 7 6 - 24 mg/dL   Creatinine, Ser 9.10 0.57 - 1.00 mg/dL   eGFR 81 >40 fO/fpw/8.26   BUN/Creatinine Ratio 8  (L) 9 - 23   Sodium 136 134 - 144 mmol/L   Potassium 4.0 3.5 - 5.2 mmol/L   Chloride 101 96 - 106 mmol/L   CO2 20 20 - 29 mmol/L   Calcium  8.9 8.7 - 10.2 mg/dL   Total Protein 7.2 6.0 - 8.5 g/dL   Albumin 4.2 3.9 - 4.9 g/dL   Globulin, Total 3.0 1.5 - 4.5 g/dL   Bilirubin Total 0.7 0.0 - 1.2 mg/dL   Alkaline Phosphatase 84 44 - 121 IU/L   AST 15 0 - 40 IU/L   ALT 17 0 - 32 IU/L  Hepatitis C antibody   Collection Time: 09/29/22 10:36 AM  Result Value Ref Range   Hep C Virus Ab Non Reactive Non Reactive  HIV Antibody (routine testing w rflx)   Collection Time: 09/29/22 10:36 AM  Result Value Ref Range   HIV Screen 4th Generation wRfx Non Reactive Non Reactive  Lipid panel   Collection Time: 09/29/22 10:36 AM  Result Value Ref Range   Cholesterol, Total 201 (H) 100 - 199 mg/dL   Triglycerides 857 0 - 149 mg/dL   HDL 58 >60 mg/dL   VLDL Cholesterol Cal 25 5 - 40 mg/dL   LDL Chol Calc (NIH) 881 (H) 0 - 99 mg/dL   Chol/HDL Ratio 3.5 0.0 - 4.4 ratio  TSH   Collection Time: 09/29/22 10:36 AM  Result Value Ref Range   TSH 1.130 0.450 - 4.500 uIU/mL  VITAMIN D  25 Hydroxy (Vit-D Deficiency, Fractures)   Collection Time: 09/29/22 10:36 AM  Result Value Ref Range   Vit D, 25-Hydroxy 29.1 (L) 30.0 - 100.0 ng/mL  Brain natriuretic peptide   Collection Time: 09/29/22 10:36 AM  Result Value Ref Range   BNP 5.2 0.0 - 100.0 pg/mL  Vitamin B12   Collection Time: 09/29/22 10:36 AM  Result Value Ref Range   Vitamin B-12 >2000 (H) 232 - 1245 pg/mL       Assessment & Plan:   Problem List Items Addressed This Visit     OSA (obstructive sleep apnea)   Encounter for annual physical exam - Primary   CPE completed today. Review of HM activities and recommendations  discussed and provided on AVS. Anticipatory guidance, diet, and exercise recommendations provided. Medications, allergies, and hx reviewed and updated as necessary. Orders placed as listed below.  Plan: - Labs ordered. Will make  changes as necessary based on results.  - I will review these results and send recommendations via MyChart or a telephone call.  - F/U with CPE in 1 year or sooner for acute/chronic health needs as directed.        Venous insufficiency   Relevant Medications   NIFEdipine  (PROCARDIA  XL/NIFEDICAL XL) 60 MG 24 hr tablet   Menorrhagia with regular cycle   Vitamin D  deficiency   Relevant Medications   VITAMIN D  PO   Other Relevant Orders   VITAMIN D  25 Hydroxy (Vit-D Deficiency, Fractures)   B12 deficiency   Relevant Medications   Multiple Vitamins-Iron (ONE-TABLET-DAILY/IRON PO)   Other Relevant Orders   Vitamin B12   Essential hypertension   Relevant Medications   NIFEdipine  (PROCARDIA  XL/NIFEDICAL XL) 60 MG 24 hr tablet   Other Relevant Orders   CBC with Differential/Platelet   CMP14+EGFR   Hemoglobin A1c   Lipid panel   Obesity (BMI 30-39.9)   Relevant Orders   CBC with Differential/Platelet   CMP14+EGFR   Hemoglobin A1c   Lipid panel   Mixed hyperlipidemia   Relevant Medications   NIFEdipine  (PROCARDIA  XL/NIFEDICAL XL) 60 MG 24 hr tablet   Other Relevant Orders   Lipid panel    Menorrhagia and uterine fibroids Reports heavy menstrual bleeding requiring both tampons and pads. Previous metformin use exacerbated bleeding. Currently on medication to manage flow but occasionally forgets refills. Fibroids present but not significantly contributing to symptoms. Family history of hysterectomy for similar symptoms. - Continue medication to manage menstrual flow  Obstructive sleep apnea Chronic condition managed with CPAP therapy. Reports improved sleep quality with CPAP use. Occasionally forgets to activate CPAP after bathroom breaks, leading to continued snoring. No significant nasal obstruction or deviated septum per ENT. Occasional congestion upon waking, no significant dryness or epistaxis. - Continue CPAP therapy - Consider using humidifier with CPAP if experiencing  dryness  Hypertension Blood pressure well-controlled with current management. Discussed potential risks of appetite suppressants like phentermine, which can increase heart rate and blood pressure. - Continue current antihypertensive regimen  Obesity Discussed previous weight loss strategies, including appetite suppressants and dietary plans. Current management includes increased water intake and consideration of fiber intake to promote satiety. Discussed potential use of previous appetite suppressant drops if no side effects were experienced. - Increase water intake to 80-90 ounces daily - Consider reinitiating appetite suppressant drops if previously effective and well-tolerated - Focus on high-protein, low-carb meals and snacks - Avoid high doses of fiber initially to prevent gastrointestinal discomfort  Chronic venous insufficiency of lower extremities Uses compression socks during travel and manages fluid intake to reduce swelling. No significant swelling noted during examination. Advised on lifestyle modifications to manage symptoms. - Continue use of compression socks as needed - Monitor fluid intake and avoid prolonged standing  Follow up plan: Return in about 1 year (around 10/01/2024) for CPE.  NEXT PREVENTATIVE PHYSICAL DUE IN 1 YEAR.  PATIENT COUNSELING PROVIDED FOR ALL ADULT PATIENTS: A well balanced diet low in saturated fats, cholesterol, and moderation in carbohydrates.  This can be as simple as monitoring portion sizes and cutting back on sugary beverages such as soda and juice to start with.    Daily water consumption of at least 64 ounces.  Physical activity at least 180 minutes  per week.  If just starting out, start 10 minutes a day and work your way up.   This can be as simple as taking the stairs instead of the elevator and walking 2-3 laps around the office  purposefully every day.   STD protection, partner selection, and regular testing if high risk.  Limited  consumption of alcoholic beverages if alcohol is consumed. For men, I recommend no more than 14 alcoholic beverages per week, spread out throughout the week (max 2 per day). Avoid binge drinking or consuming large quantities of alcohol in one setting.  Please let me know if you feel you may need help with reduction or quitting alcohol consumption.   Avoidance of nicotine, if used. Please let me know if you feel you may need help with reduction or quitting nicotine use.   Daily mental health attention. This can be in the form of 5 minute daily meditation, prayer, journaling, yoga, reflection, etc.  Purposeful attention to your emotions and mental state can significantly improve your overall wellbeing  and  Health.  Please know that I am here to help you with all of your health care goals and am happy to work with you to find a solution that works best for you.  The greatest advice I have received with any changes in life are to take it one step at a time, that even means if all you can focus on is the next 60 seconds, then do that and celebrate your victories.  With any changes in life, you will have set backs, and that is OK. The important thing to remember is, if you have a set back, it is not a failure, it is an opportunity to try again! Screening Testing Mammogram Every 1 -2 years based on history and risk factors Starting at age 48 Pap Smear Ages 21-39 every 3 years Ages 45-65 every 5 years with HPV testing More frequent testing may be required based on results and history Colon Cancer Screening Every 1-10 years based on test performed, risk factors, and history Starting at age 59 Bone Density Screening Every 2-10 years based on history Starting at age 23 for women Recommendations for men differ based on medication usage, history, and risk factors AAA Screening One time ultrasound Men 63-59 years old who have every smoked Lung Cancer Screening Low Dose Lung CT every 12  months Age 82-80 years with a 30 pack-year smoking history who still smoke or who have quit within the last 15 years   Screening Labs Routine  Labs: Complete Blood Count (CBC), Complete Metabolic Panel (CMP), Cholesterol (Lipid Panel) Every 6-12 months based on history and medications May be recommended more frequently based on current conditions or previous results Hemoglobin A1c Lab Every 3-12 months based on history and previous results Starting at age 57 or earlier with diagnosis of diabetes, high cholesterol, BMI >26, and/or risk factors Frequent monitoring for patients with diabetes to ensure blood sugar control Thyroid Panel (TSH) Every 6 months based on history, symptoms, and risk factors May be repeated more often if on medication HIV One time testing for all patients 58 and older May be repeated more frequently for patients with increased risk factors or exposure Hepatitis C One time testing for all patients 35 and older May be repeated more frequently for patients with increased risk factors or exposure Gonorrhea, Chlamydia Every 12 months for all sexually active persons 13-24 years Additional monitoring may be recommended for those who are considered high risk or  who have symptoms Every 12 months for any woman on birth control, regardless of sexual activity PSA Men 63-67 years old with risk factors Additional screening may be recommended from age 45-69 based on risk factors, symptoms, and history  Vaccine Recommendations Tetanus Booster All adults every 10 years Flu Vaccine All patients 6 months and older every year COVID Vaccine All patients 12 years and older Initial dosing with booster May recommend additional booster based on age and health history HPV Vaccine 2 doses all patients age 82-26 Dosing may be considered for patients over 26 Shingles Vaccine (Shingrix) 2 doses all adults 55 years and older Pneumonia (Pneumovax 82) All adults 65 years and  older May recommend earlier dosing based on health history One year apart from Prevnar 49 Pneumonia (Prevnar 23) All adults 65 years and older Dosed 1 year after Pneumovax 23 Pneumonia (Prevnar 20) One time alternative to the two dosing of 13 and 23 For all adults with initial dose of 23, 20 is recommended 1 year later For all adults with initial dose of 13, 23 is still recommended as second option 1 year later

## 2023-10-02 NOTE — Patient Instructions (Addendum)
 You look great today! I will let you know what your labs show and if we need to change anything based on this.   Continue with your CPAP to help with the sleep apnea.   Your blood pressure is amazing! I have sent in a years worth of refills for you. If you notice a change in your BP please let me know.    WEIGHT LOSS PLANNING  For best management of weight, it is vital to balance intake versus output. This means the number of calories burned per day must be less than the calories you take in with food and drink.   I recommend trying to follow a diet with the following: Calories: 1200-1500 calories per day Carbohydrates: 150-180 grams of carbohydrates per day  Why: Gives your body enough quick fuel for cells to maintain normal function without sending them into starvation mode.  Protein: At least 90 grams of protein per day- 30 grams with each meal Why: Protein takes longer and uses more energy than carbohydrates to break down for fuel. The carbohydrates in your meals serves as quick energy sources and proteins help use some of that extra quick energy to break down to produce long term energy. This helps you not feel hungry as quickly and protein breakdown burns calories.  Water: Drink AT LEAST 84 ounces of water per day  Why: Water is essential to healthy metabolism. Water helps to fill the stomach and keep you fuller longer. Water is required for healthy digestion and filtering of waste in the body.  Fat: Limit fats in your diet- when choosing fats, choose foods with lower fats content such as lean meats (chicken, fish, [place]).  Why: Increased fat intake leads to storage for later. Once you burn your carbohydrate energy, your body goes into fat and protein breakdown mode to help you loose weight.  Cholesterol: Fats and oils that are LIQUID at room temperature are best. Choose vegetable oils (olive oil, avocado oil, nuts). Avoid fats that are SOLID at room temperature (animal fats,  processed meats). Healthy fats are often found in whole grains, beans, nuts, seeds, and berries.  Why: Elevated cholesterol levels lead to build up of cholesterol on the inside of your blood vessels. This will eventually cause the blood vessels to become hard and can lead to high blood pressure and damage to your organs. When the blood flow is reduced, but the pressure is high from cholesterol buildup, parts of the cholesterol can break off and form clots that can go to the brain or heart leading to a stroke or heart attack.  Fiber: Increase amount of SOLUBLE the fiber in your diet. This helps to fill you up, lowers cholesterol, and helps with digestion. Some foods high in soluble fiber are oats, peas, beans, apples, carrots, barley, and citrus fruits.   Why: Fiber fills you up, helps remove excess cholesterol, and aids in healthy digestion which are all very important in weight management.   I recommend the following as a minimum activity routine: Purposeful walk or other physical activity at least 20 minutes every single day. This means purposefully taking a walk, jog, bike, swim, treadmill, elliptical, dance, etc.  This activity should be ABOVE your normal daily activities, such as walking at work. Goal exercise should be at least 150 minutes a week- work your way up to this.   Heart Rate: Your maximum exercise heart rate should be 220 - Your Age in Years. When exercising, get your heart rate up, but  avoid going over the maximum targeted heart rate.  60-70% of your maximum heart rate is where you tend to burn the most fat. To find this number:  220 - Age In Years= Max HR  Max HR x 0.6 (or 0.7) = Fat Burning HR The Fat Burning HR is your goal heart rate while working out to burn the most fat.  NEVER exercise to the point your feel lightheaded, weak, nauseated, dizzy. If you experience ANY of these symptoms- STOP exercise! Allow yourself to cool down and your heart rate to come down. Then restart  slower next time.  If at ANY TIME you feel chest pain or chest pressure during exercise, STOP IMMEDIATELY and seek medical attention.    For all adult patients, I recommend A well balanced diet low in saturated fats, cholesterol, and moderation in carbohydrates.   This can be as simple as monitoring portion sizes and cutting back on sugary beverages such as soda and juice to start with.    Daily water consumption of at least 64 ounces.  Physical activity at least 180 minutes per week, if just starting out.   This can be as simple as taking the stairs instead of the elevator and walking 2-3 laps around the office  purposefully every day.   STD protection, partner selection, and regular testing if high risk.  Limited consumption of alcoholic beverages if alcohol is consumed.  For women, I recommend no more than 7 alcoholic beverages per week, spread out throughout the week.  Avoid binge drinking or consuming large quantities of alcohol in one setting.   Please let me know if you feel you may need help with reduction or quitting alcohol consumption.   Avoidance of nicotine, if used.  Please let me know if you feel you may need help with reduction or quitting nicotine use.   Daily mental health attention.  This can be in the form of 5 minute daily meditation, prayer, journaling, yoga, reflection, etc.   Purposeful attention to your emotions and mental state can significantly improve your overall wellbeing  and  Health.  Please know that I am here to help you with all of your health care goals and am happy to work with you to find a solution that works best for you.  The greatest advice I have received with any changes in life are to take it one step at a time, that even means if all you can focus on is the next 60 seconds, then do that and celebrate your victories.  With any changes in life, you will have set backs, and that is OK. The important thing to remember is, if you have a set back,  it is not a failure, it is an opportunity to try again!  Health Maintenance Recommendations Screening Testing Mammogram Every 1 -2 years based on history and risk factors Starting at age 77 Pap Smear Ages 21-39 every 3 years Ages 37-65 every 5 years with HPV testing More frequent testing may be required based on results and history Colon Cancer Screening Every 1-10 years based on test performed, risk factors, and history Starting at age 51 Bone Density Screening Every 2-10 years based on history Starting at age 38 for women Recommendations for men differ based on medication usage, history, and risk factors AAA Screening One time ultrasound Men 17-74 years old who have every smoked Lung Cancer Screening Low Dose Lung CT every 12 months Age 12-80 years with a 30 pack-year smoking history who  still smoke or who have quit within the last 15 years  Screening Labs Routine  Labs: Complete Blood Count (CBC), Complete Metabolic Panel (CMP), Cholesterol (Lipid Panel) Every 6-12 months based on history and medications May be recommended more frequently based on current conditions or previous results Hemoglobin A1c Lab Every 3-12 months based on history and previous results Starting at age 8 or earlier with diagnosis of diabetes, high cholesterol, BMI >26, and/or risk factors Frequent monitoring for patients with diabetes to ensure blood sugar control Thyroid Panel (TSH w/ T3 & T4) Every 6 months based on history, symptoms, and risk factors May be repeated more often if on medication HIV One time testing for all patients 70 and older May be repeated more frequently for patients with increased risk factors or exposure Hepatitis C One time testing for all patients 57 and older May be repeated more frequently for patients with increased risk factors or exposure Gonorrhea, Chlamydia Every 12 months for all sexually active persons 13-24 years Additional monitoring may be recommended for  those who are considered high risk or who have symptoms PSA Men 60-70 years old with risk factors Additional screening may be recommended from age 29-69 based on risk factors, symptoms, and history  Vaccine Recommendations Tetanus Booster All adults every 10 years Flu Vaccine All patients 6 months and older every year COVID Vaccine All patients 12 years and older Initial dosing with booster May recommend additional booster based on age and health history HPV Vaccine 2 doses all patients age 76-26 Dosing may be considered for patients over 26 Shingles Vaccine (Shingrix) 2 doses all adults 55 years and older Pneumonia (Pneumovax 23) All adults 65 years and older May recommend earlier dosing based on health history Pneumonia (Prevnar 61) All adults 65 years and older Dosed 1 year after Pneumovax 23  Additional Screening, Testing, and Vaccinations may be recommended on an individualized basis based on family history, health history, risk factors, and/or exposure.

## 2023-10-02 NOTE — Assessment & Plan Note (Signed)

## 2023-10-03 ENCOUNTER — Ambulatory Visit: Payer: Self-pay | Admitting: Nurse Practitioner

## 2023-10-03 DIAGNOSIS — E782 Mixed hyperlipidemia: Secondary | ICD-10-CM

## 2023-10-03 DIAGNOSIS — R7989 Other specified abnormal findings of blood chemistry: Secondary | ICD-10-CM

## 2023-10-03 LAB — CMP14+EGFR
ALT: 19 IU/L (ref 0–32)
AST: 21 IU/L (ref 0–40)
Albumin: 4.5 g/dL (ref 3.9–4.9)
Alkaline Phosphatase: 107 IU/L (ref 44–121)
BUN/Creatinine Ratio: 8 — ABNORMAL LOW (ref 9–23)
BUN: 9 mg/dL (ref 6–24)
Bilirubin Total: 0.6 mg/dL (ref 0.0–1.2)
CO2: 22 mmol/L (ref 20–29)
Calcium: 9.9 mg/dL (ref 8.7–10.2)
Chloride: 100 mmol/L (ref 96–106)
Creatinine, Ser: 1.06 mg/dL — ABNORMAL HIGH (ref 0.57–1.00)
Globulin, Total: 3.3 g/dL (ref 1.5–4.5)
Glucose: 98 mg/dL (ref 70–99)
Potassium: 4.2 mmol/L (ref 3.5–5.2)
Sodium: 137 mmol/L (ref 134–144)
Total Protein: 7.8 g/dL (ref 6.0–8.5)
eGFR: 65 mL/min/1.73 (ref >59)

## 2023-10-03 LAB — CBC WITH DIFFERENTIAL/PLATELET
Basophils Absolute: 0.1 x10E3/uL (ref 0.0–0.2)
Basos: 1 %
EOS (ABSOLUTE): 0.1 x10E3/uL (ref 0.0–0.4)
Eos: 1 %
Hematocrit: 40.5 % (ref 34.0–46.6)
Hemoglobin: 11.6 g/dL (ref 11.1–15.9)
Immature Grans (Abs): 0 x10E3/uL (ref 0.0–0.1)
Immature Granulocytes: 0 %
Lymphocytes Absolute: 1.4 x10E3/uL (ref 0.7–3.1)
Lymphs: 20 %
MCH: 22 pg — ABNORMAL LOW (ref 26.6–33.0)
MCHC: 28.6 g/dL — ABNORMAL LOW (ref 31.5–35.7)
MCV: 77 fL — ABNORMAL LOW (ref 79–97)
Monocytes Absolute: 0.6 x10E3/uL (ref 0.1–0.9)
Monocytes: 8 %
Neutrophils Absolute: 5 x10E3/uL (ref 1.4–7.0)
Neutrophils: 70 %
Platelets: 395 x10E3/uL (ref 150–450)
RBC: 5.28 x10E6/uL (ref 3.77–5.28)
RDW: 15.1 % (ref 11.7–15.4)
WBC: 7.1 x10E3/uL (ref 3.4–10.8)

## 2023-10-03 LAB — LIPID PANEL
Chol/HDL Ratio: 4.4 ratio (ref 0.0–4.4)
Cholesterol, Total: 257 mg/dL — ABNORMAL HIGH (ref 100–199)
HDL: 58 mg/dL (ref >39)
LDL Chol Calc (NIH): 176 mg/dL — ABNORMAL HIGH (ref 0–99)
Triglycerides: 128 mg/dL (ref 0–149)
VLDL Cholesterol Cal: 23 mg/dL (ref 5–40)

## 2023-10-03 LAB — HEMOGLOBIN A1C
Est. average glucose Bld gHb Est-mCnc: 105 mg/dL
Hgb A1c MFr Bld: 5.3 % (ref 4.8–5.6)

## 2023-10-03 LAB — VITAMIN D 25 HYDROXY (VIT D DEFICIENCY, FRACTURES): Vit D, 25-Hydroxy: 84.5 ng/mL (ref 30.0–100.0)

## 2023-10-03 LAB — VITAMIN B12: Vitamin B-12: >2000 pg/mL — ABNORMAL HIGH (ref 232–1245)

## 2023-10-06 MED ORDER — ATORVASTATIN CALCIUM 10 MG PO TABS
10.0000 mg | ORAL_TABLET | Freq: Every day | ORAL | 1 refills | Status: AC
Start: 2023-10-06 — End: ?

## 2024-10-09 ENCOUNTER — Encounter: Payer: Self-pay | Admitting: Nurse Practitioner
